# Patient Record
Sex: Male | Born: 1955 | Race: White | Hispanic: No | Marital: Married | Smoking: Never smoker
Health system: Southern US, Academic
[De-identification: ages and names within clinical notes are randomized; demographics above are authoritative.]

## PROBLEM LIST (undated history)

## (undated) DIAGNOSIS — E7800 Pure hypercholesterolemia, unspecified: Secondary | ICD-10-CM

## (undated) DIAGNOSIS — I10 Essential (primary) hypertension: Secondary | ICD-10-CM

## (undated) DIAGNOSIS — K219 Gastro-esophageal reflux disease without esophagitis: Secondary | ICD-10-CM

## (undated) DIAGNOSIS — G4730 Sleep apnea, unspecified: Secondary | ICD-10-CM

## (undated) HISTORY — PX: HX BACK SURGERY: SHX140

## (undated) HISTORY — PX: HX GALL BLADDER SURGERY/CHOLE: SHX55

## (undated) HISTORY — PX: HX KNEE ARTHROSCOPY: SHX127

---

## 2014-07-22 ENCOUNTER — Emergency Department (HOSPITAL_COMMUNITY): Payer: Self-pay | Admitting: Emergency Medicine

## 2017-12-11 NOTE — Progress Notes (Signed)
 Steven Juarez returns following an uncomplicated laparoscopic cholecystectomy on Nov 14, 2017 for biliary dyskinesia with hyperdynamic gallbladder emptying.  He reports no difficulty with GI function, with resolution of his bloating and only a couple of emeses.  Overall, he is quite pleased with the results, able to eat a salad now with dressing without difficulty.      On exam, all of the port sites are healing nicely without any sign of infection, although there is still a bit of residual redness around the umbilicus from a presumed infection with about 4 more days left on his antibiotics.    He is released back to the care of his PCP, with any further followup here on a prn basis.  He should refrain from strenuous activity or lifting in excess of 25 lbs. for another 2 weeks.

## 2020-07-10 LAB — COLOGUARD® COLON CANCER SCREEN: COLOGUARD RESULT: NEGATIVE

## 2021-07-03 IMAGING — MR MRI CERVICAL SPINE WITHOUT CONTRAST
4 of 5 series · 25 of 48 positions shown · IV contrast (gadolinium)
Comparison: None available.

﻿EXAM:  [id number]   MRI CERVICAL SPINE WITHOUT CONTRAST
INDICATION: Chronic neck pain with bilateral upper extremity radiculopathy.
TECHNIQUE: Multiplanar multisequential MRI of the cervical spine was performed without gadolinium contrast.

[Series 5: T2 · sagittal · 3.0mm · 0.78mm/px · 7 of 11 slices shown (1 of 2)]
[im 1/11]
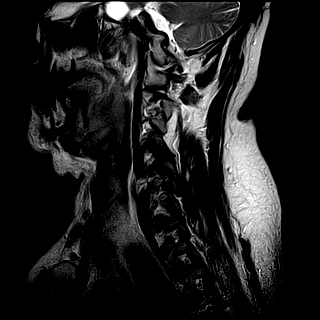
[im 2/11]
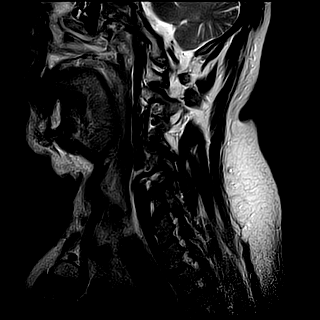
[im 4/11]
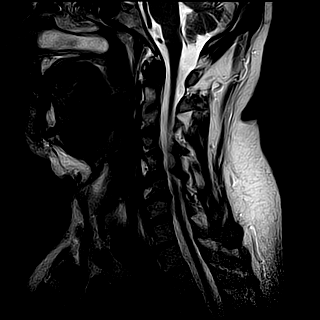
[im 6/11]
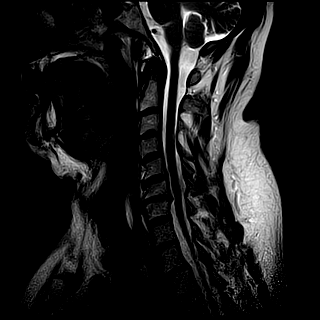
[im 7/11]
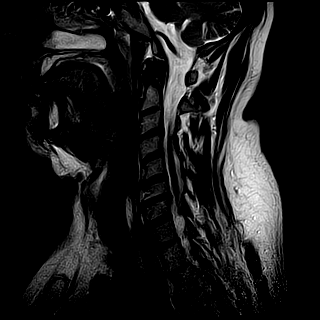
[im 9/11]
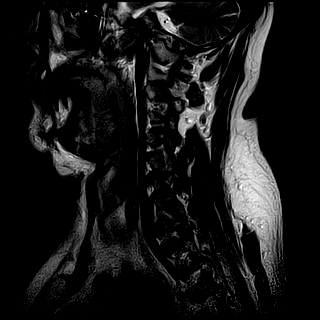
[im 11/11]
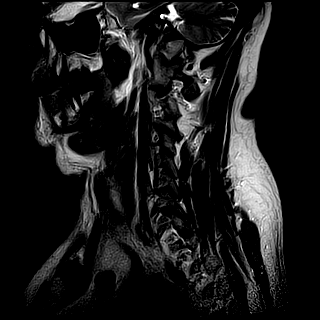

[Series 6: T1 · sagittal · 3.0mm · 0.49mm/px · 5 of 11 slices shown]
[im 1/11]
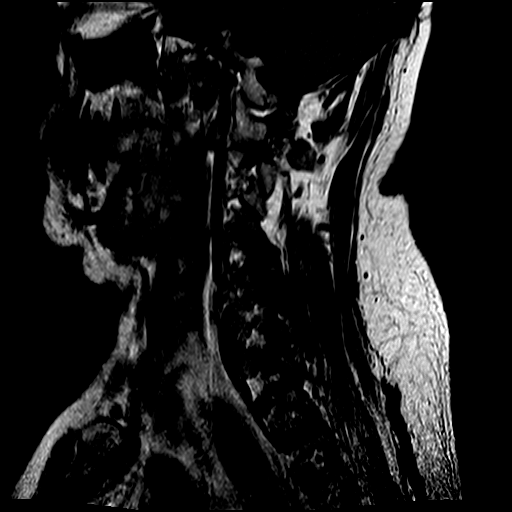
[im 2/11]
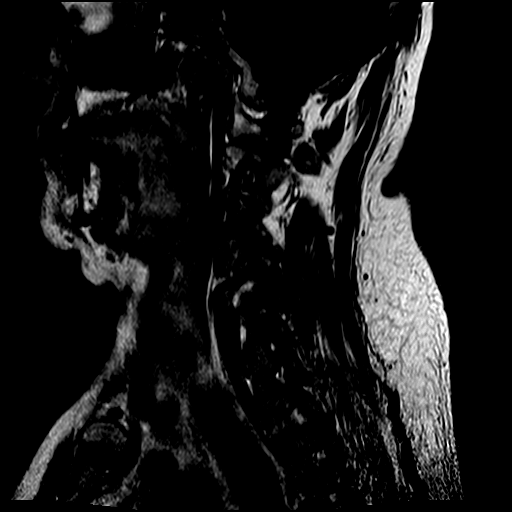
[im 4/11]
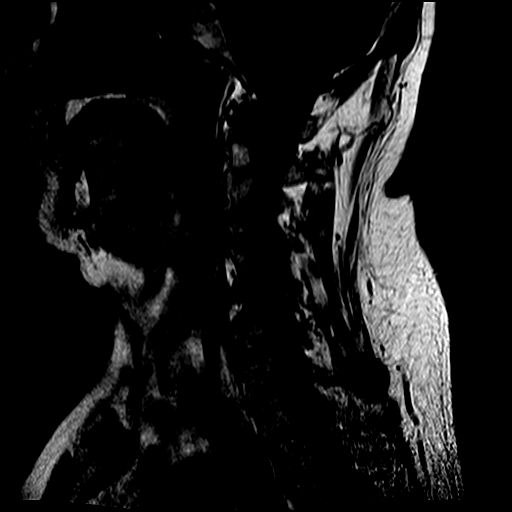
[im 6/11]
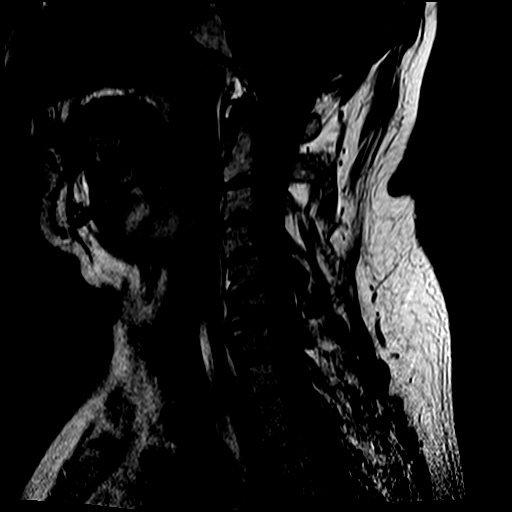
[im 9/11]
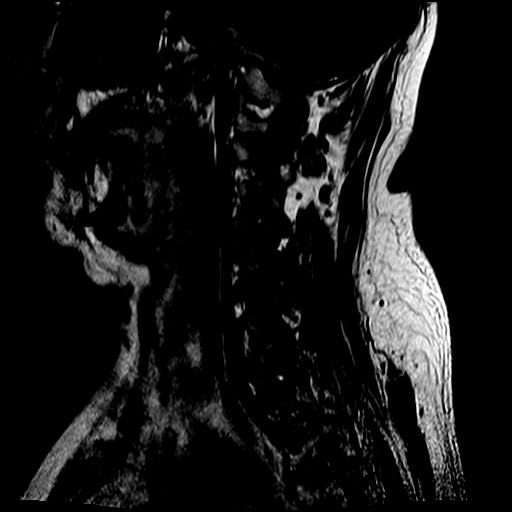

[Series 7: STIR · sagittal · 3.0mm · 0.49mm/px · 3 of 11 slices shown]
[im 2/11]
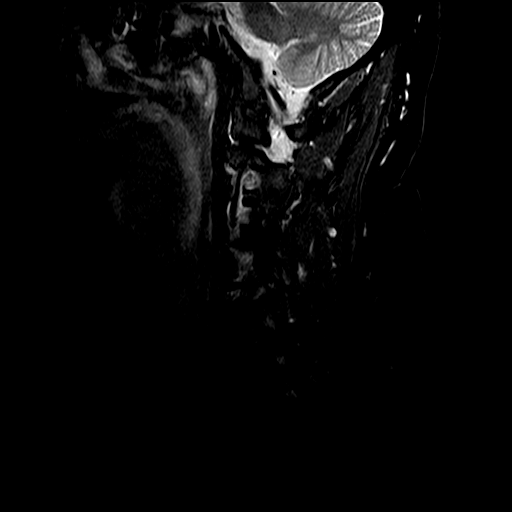
[im 6/11]
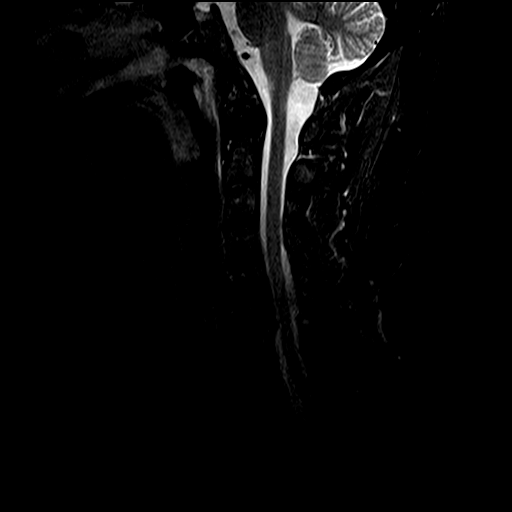
[im 9/11]
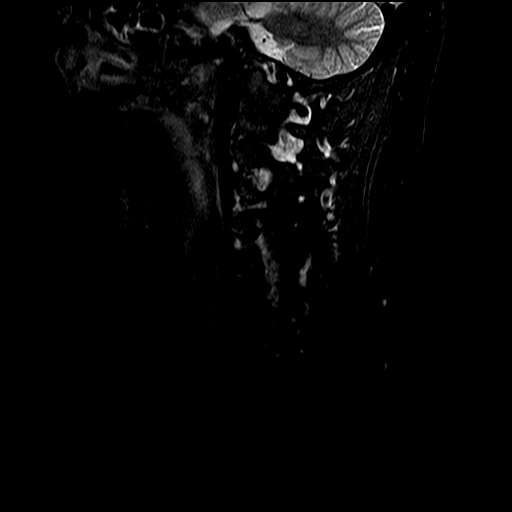

[Series 9: T2 · axial · 3.5mm · 0.40mm/px · z∈[-89,-6]mm · 10 of 26 slices shown (2 of 2)]
[im 2/26]
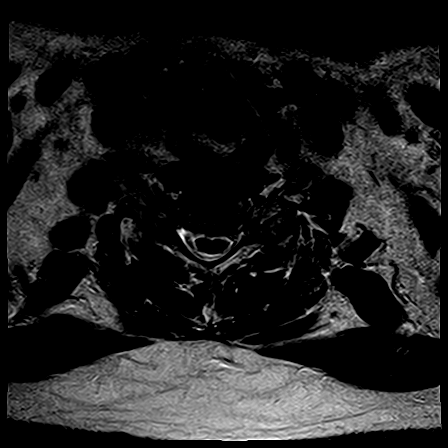
[im 4/26]
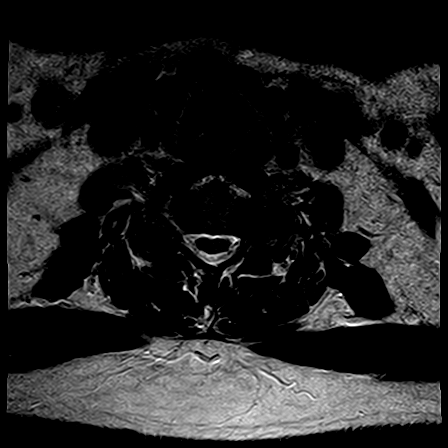
[im 6/26]
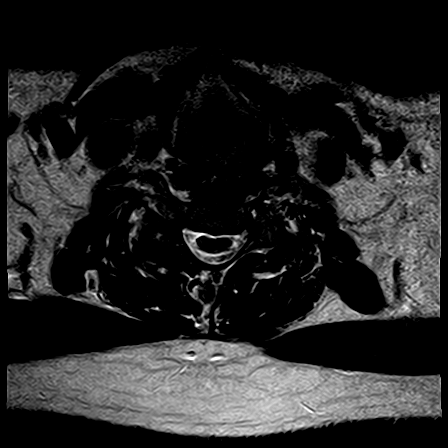
[im 9/26]
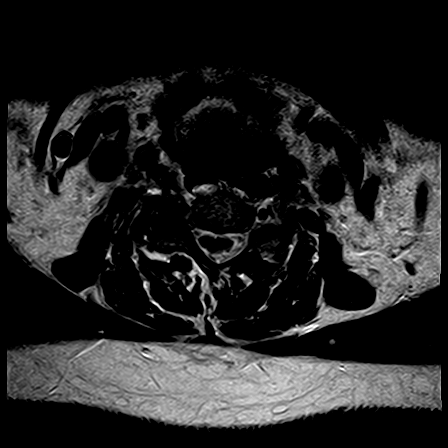
[im 12/26]
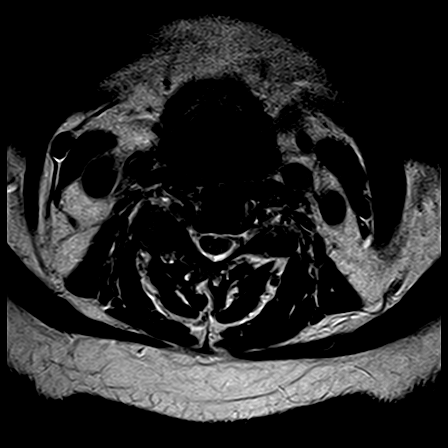
[im 14/26]
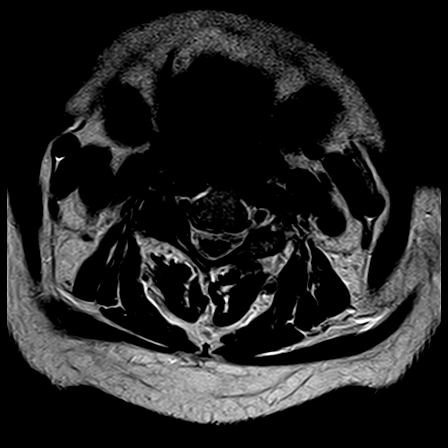
[im 16/26]
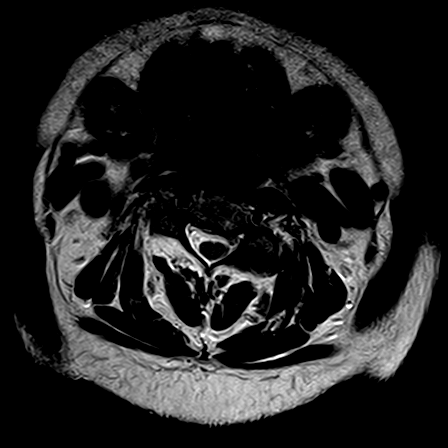
[im 19/26]
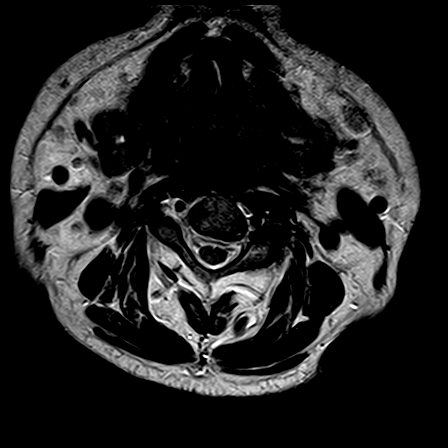
[im 22/26]
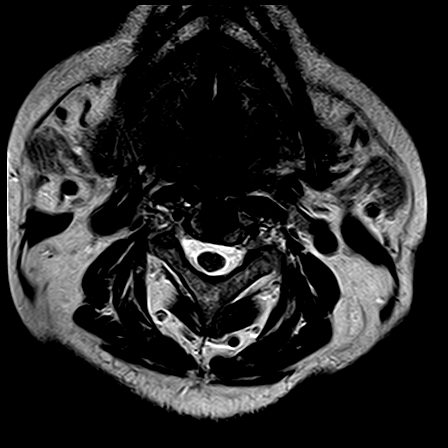
[im 26/26]
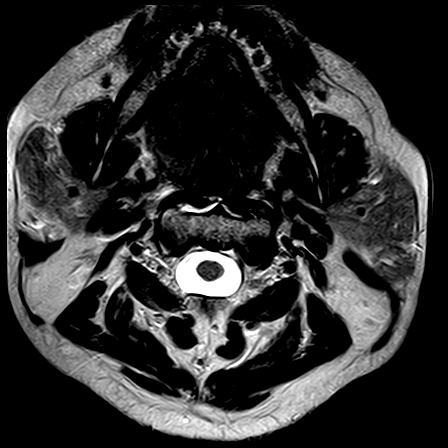

[25 of 48 positions shown; findings below may reference images not displayed]

FINDINGS: Bone marrow signal intensity is normal. There is no acute fracture or subluxation. Visualized spinal cord is normal in signal intensity without evidence of compression at any level.

C2-3 level is unremarkable.

At C3-4 level, there is severe left neural foraminal stenosis from facet and uncovertebral joint hypertrophy.

At C4-5 level, there is minimal anterolisthesis of C4 on C5 vertebral body. There is severe right and mild left neural foraminal stenosis from facet and uncovertebral joint hypertrophy.

At C5-6 level, there is minimal anterolisthesis of C5 on C6 vertebral body. There is a minimal bulging annulus, minimally effacing the ventral CSF. There is moderate to severe left and mild right neural foraminal stenosis from facet and uncovertebral joint hypertrophy.

At C6-7 level, there is a small broad-based central disc osteophyte complex with near complete effacement of the ventral CSF. There is severe left and mild right neural foraminal stenosis from facet and uncovertebral joint hypertrophy.

At C7-T1 level, there is a small broad-based central disc bulge, mildly effacing the ventral thecal sac. There is no significant neural foraminal stenosis.

Paraspinal soft tissues are unremarkable.
IMPRESSION: 1. Minimal anterolisthesis of C4 on C5 and C5 on C6 vertebral body. 

2. Near complete effacement of the ventral CSF at C6-7 level from a small central disc osteophyte complex. 

3. Multilevel neural foraminal stenosis as detailed above.

## 2021-07-03 IMAGING — MR MRI THORACIC SPINE WITHOUT CONTRAST
4 of 5 series · 26 of 48 positions shown · IV contrast (gadolinium)
Comparison: None available.

﻿EXAM:  [id number]   MRI THORACIC SPINE WITHOUT CONTRAST
INDICATION: Chronic mid back pain.
TECHNIQUE: Multiplanar multisequential MRI of the thoracic spine was performed without gadolinium contrast.

[Series 5: T2 · sagittal · 4.0mm · 0.78mm/px · 8 of 13 slices shown (1 of 2)]
[im 1/13]
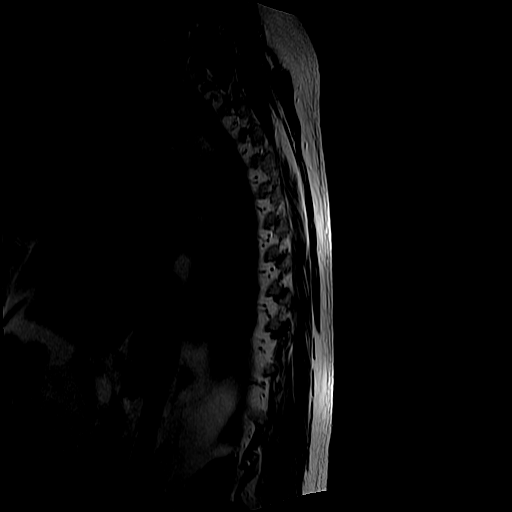
[im 2/13]
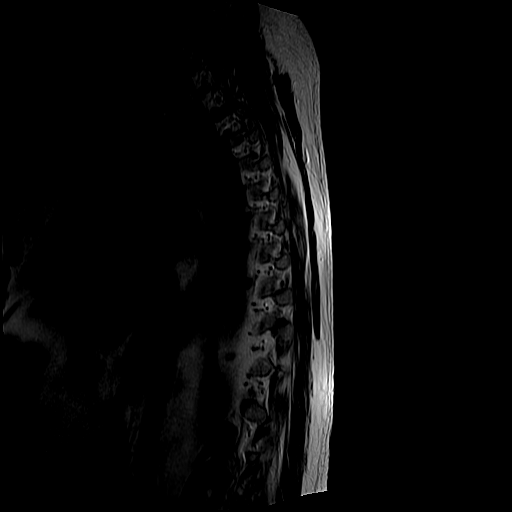
[im 5/13]
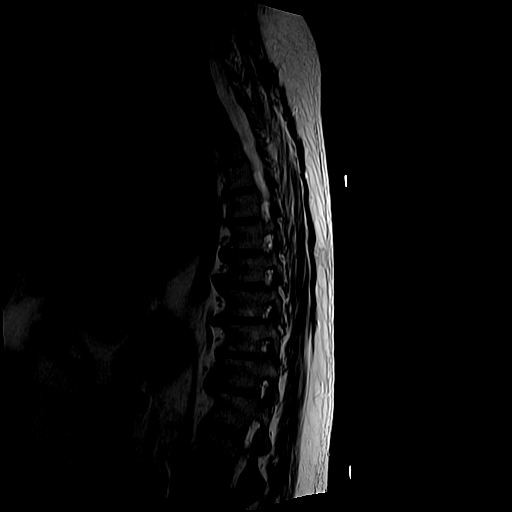
[im 6/13]
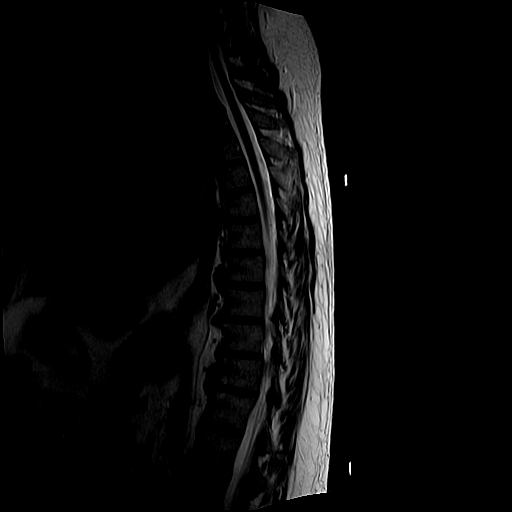
[im 7/13]
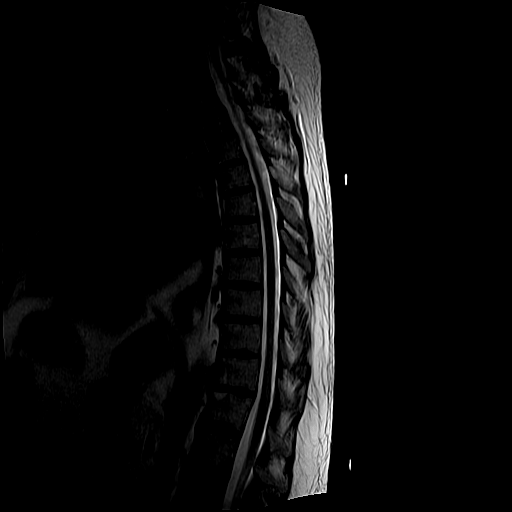
[im 9/13]
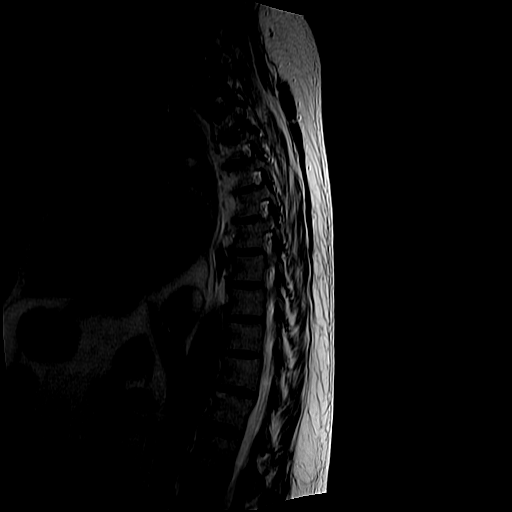
[im 11/13]
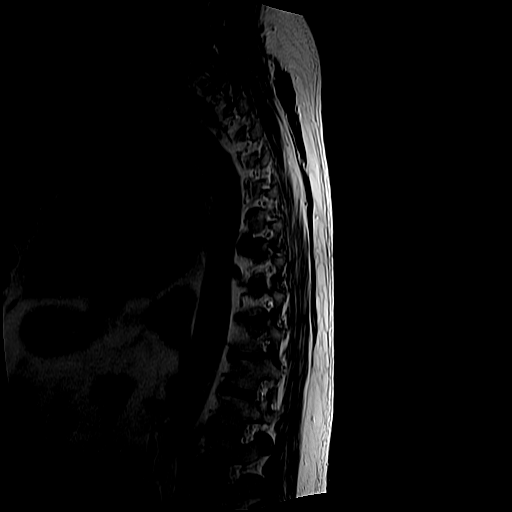
[im 13/13]
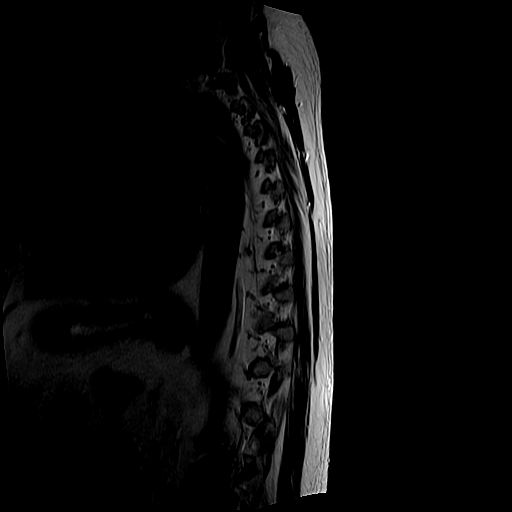

[Series 6: T1 · sagittal · 4.0mm · 0.78mm/px · 6 of 13 slices shown (1 of 2)]
[im 1/13]
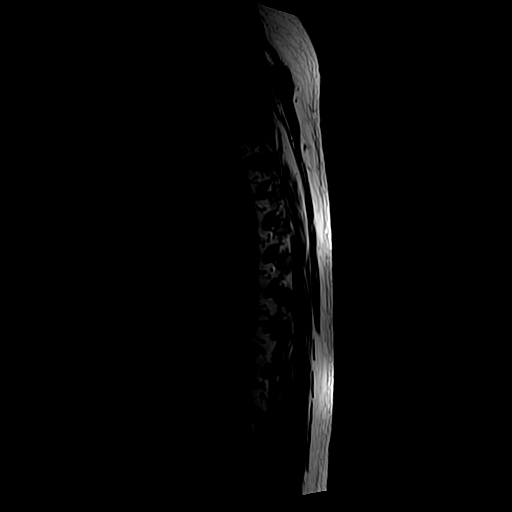
[im 2/13]
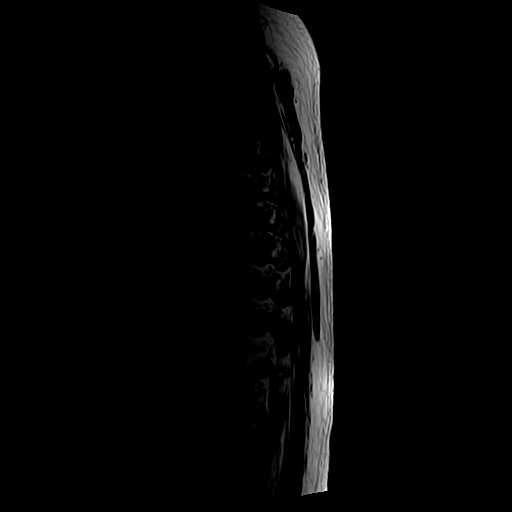
[im 5/13]
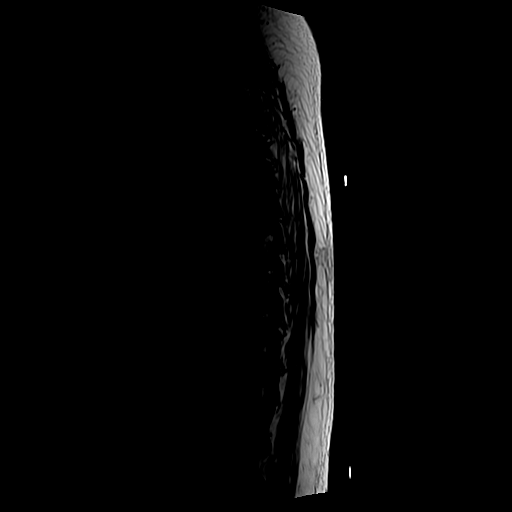
[im 6/13]
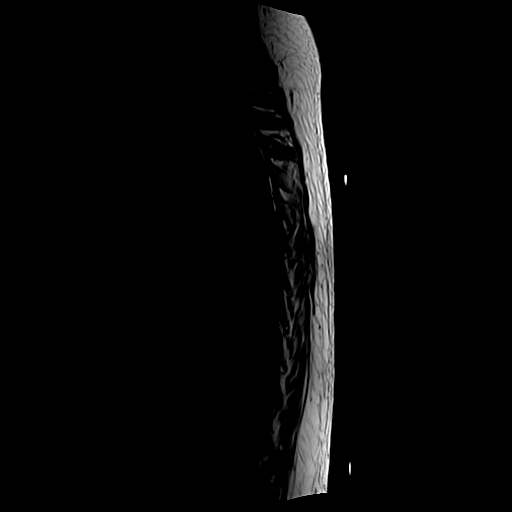
[im 7/13]
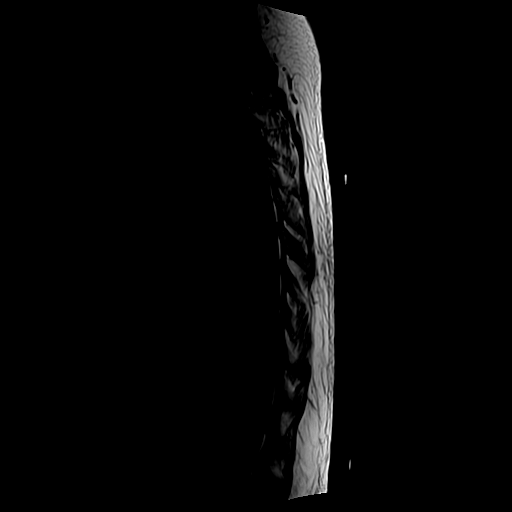
[im 11/13]
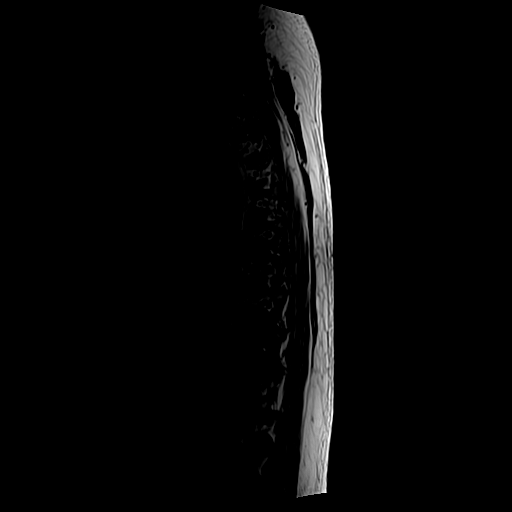

[Series 8: T2 · axial · 4.0mm · 0.62mm/px · z∈[-296,-80]mm · 9 of 12 slices shown (2 of 2)]
[im 1/12]
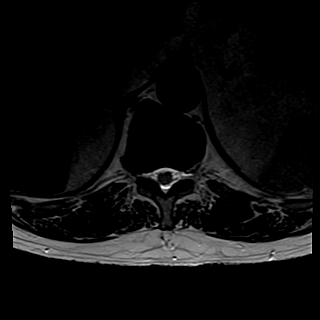
[im 2/12]
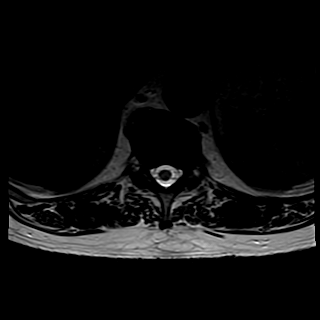
[im 3/12]
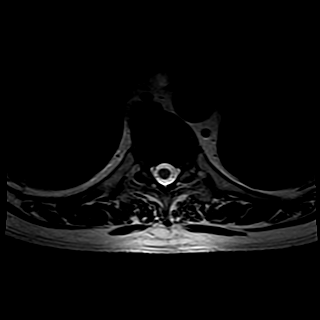
[im 5/12]
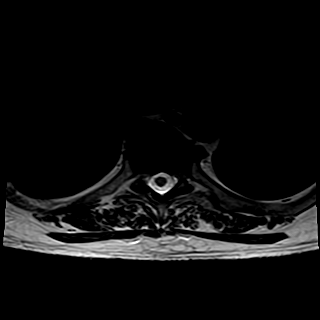
[im 6/12]
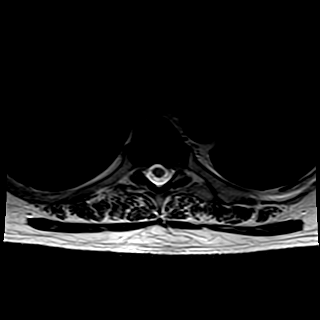
[im 7/12]
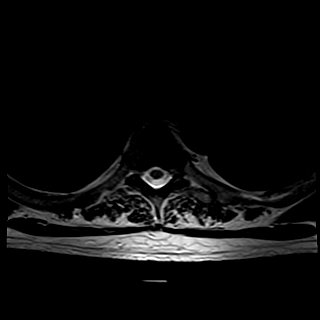
[im 9/12]
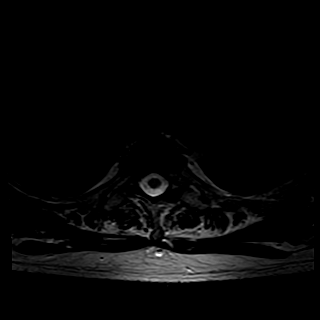
[im 10/12]
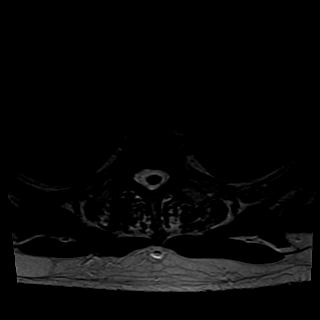
[im 12/12]
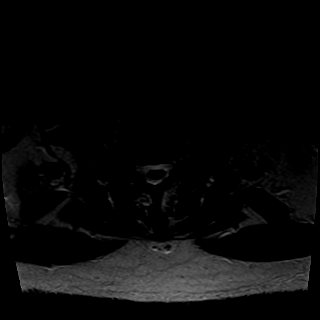

[Series 9: T1 · axial · 4.0mm · 0.62mm/px · z∈[-270,-117]mm · 3 of 12 slices shown (2 of 2)]
[im 2/12]
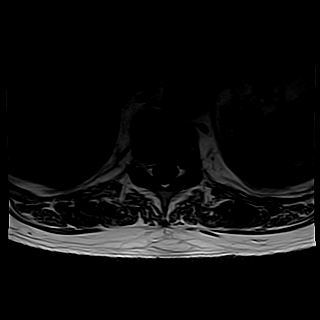
[im 6/12]
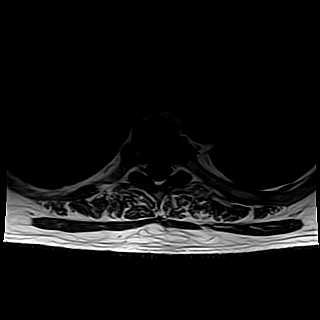
[im 10/12]
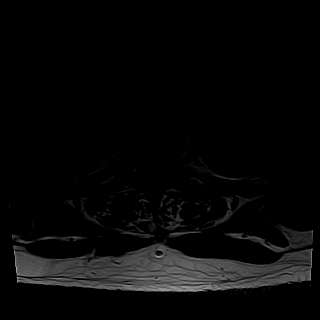

[26 of 48 positions shown; findings below may reference images not displayed]

FINDINGS: Vertebral bodies are normal in height, alignment and signal intensity. There is no acute fracture or subluxation. Visualized spinal cord is normal in signal intensity without evidence of compression at any level.

There is no significant disc herniation, spinal canal or neural foraminal stenosis at any level. Paraspinal soft tissues are also unremarkable. There is no pleural effusion.
IMPRESSION: Unremarkable exam.

## 2021-12-24 IMAGING — US CAROTID US W/DOPPLER
1 series · 14 of 24 positions shown · non-contrast
Comparison: None available.

﻿EXAM:  CAROTID US W/DOPPLER
INDICATION: Carotid stenosis.

[Series 1: carotid us w/doppler · 14 of 68 slices shown]
[im 1/68]
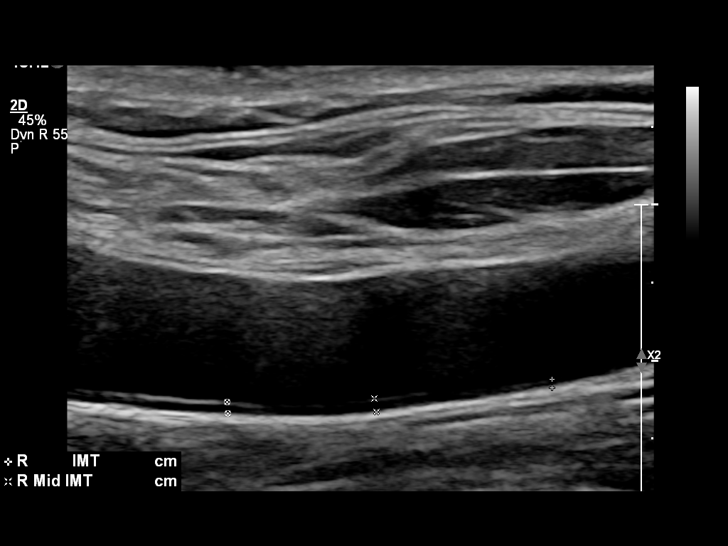
[im 6/68]
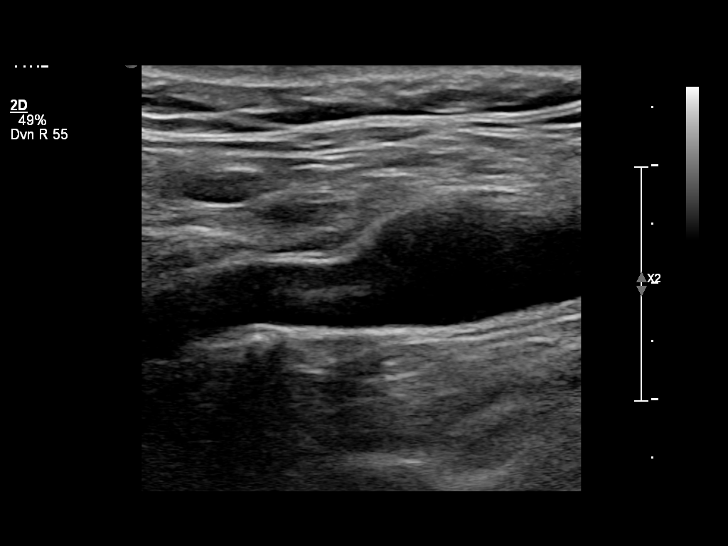
[im 12/68]
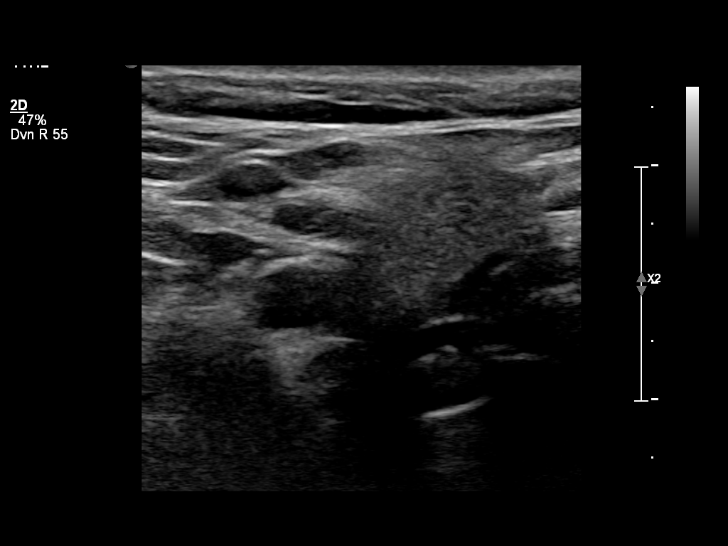
[im 18/68]
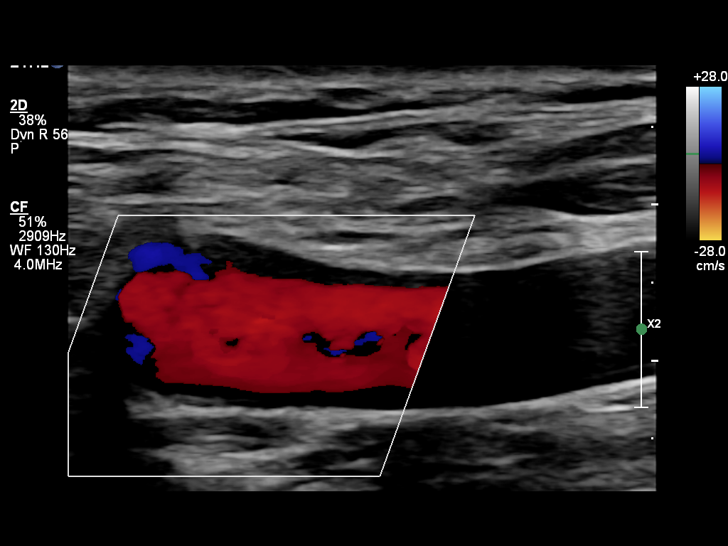
[im 21/68]
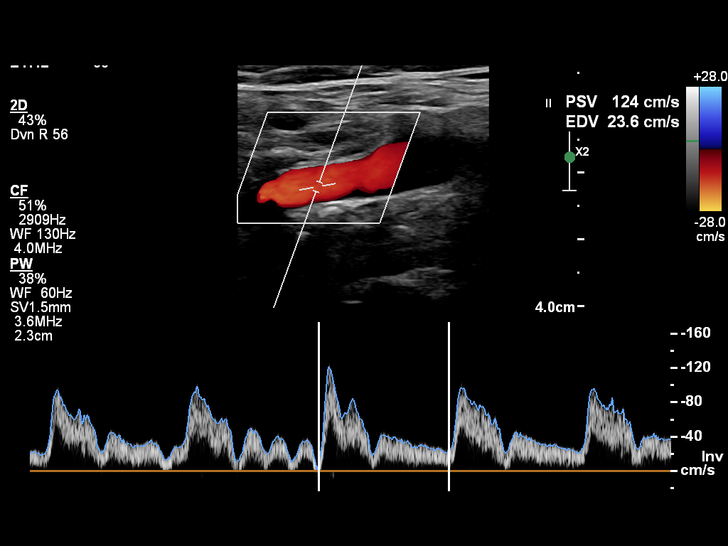
[im 27/68]
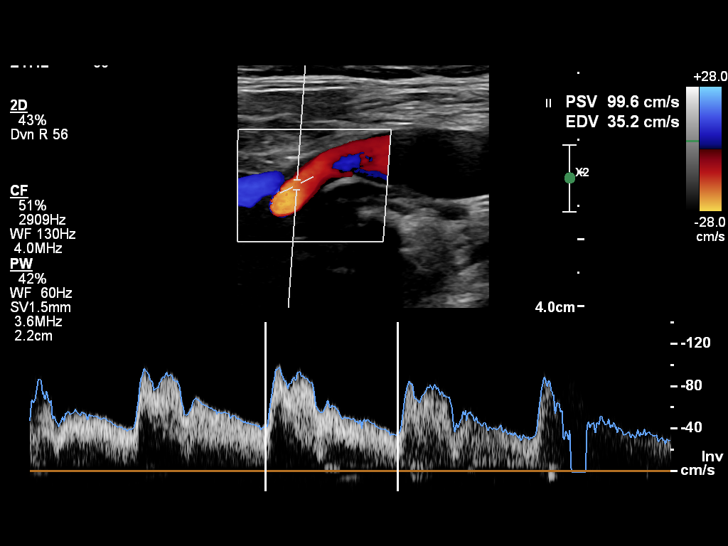
[im 33/68]
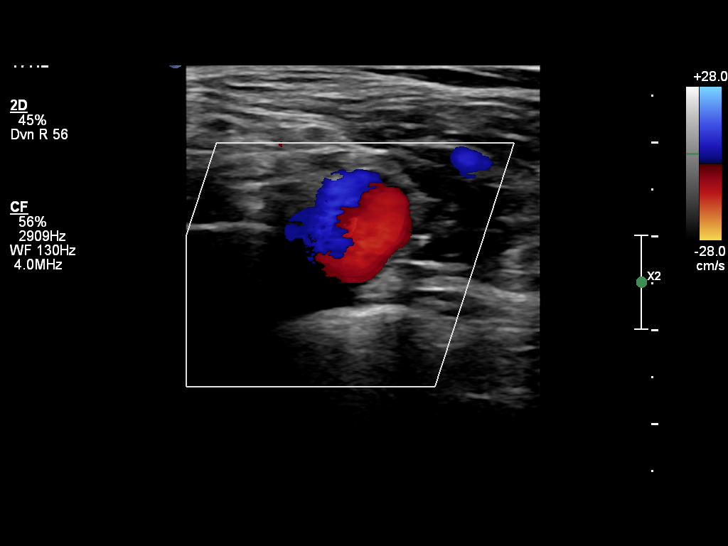
[im 35/68]
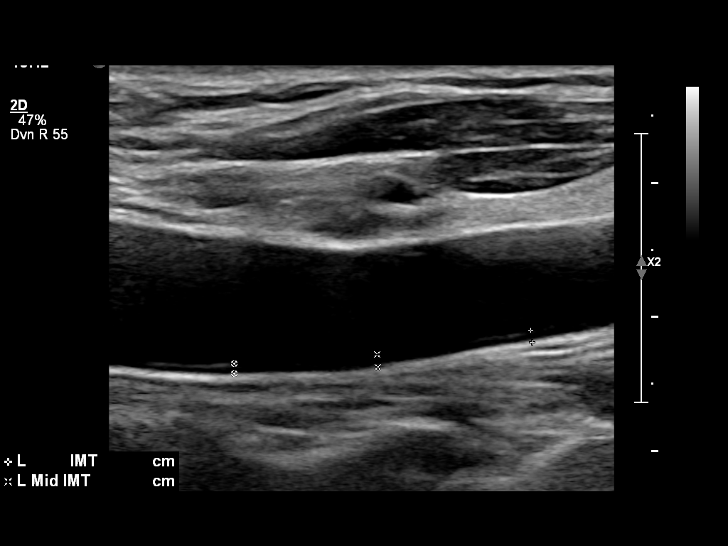
[im 41/68]
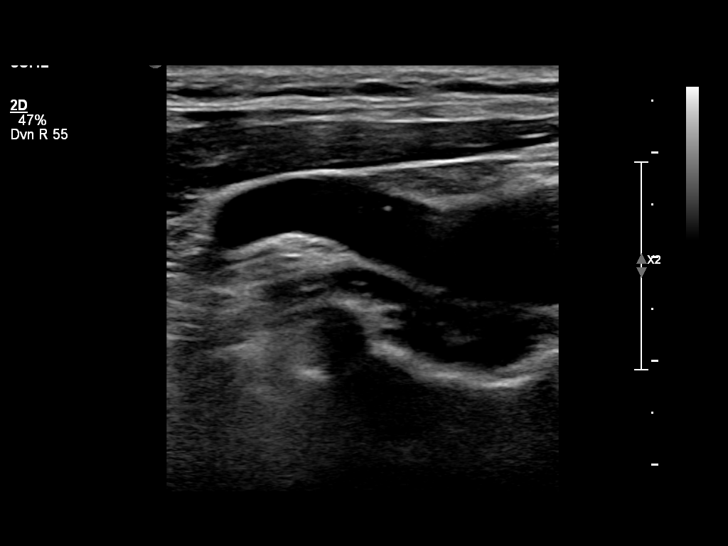
[im 47/68]
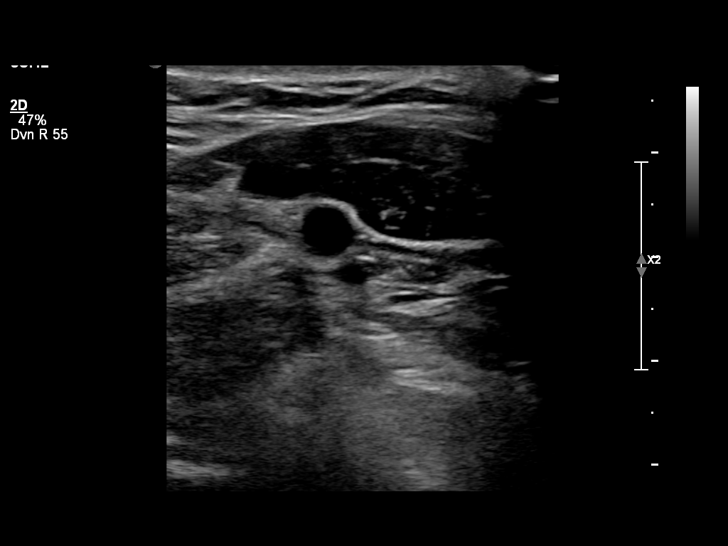
[im 53/68]
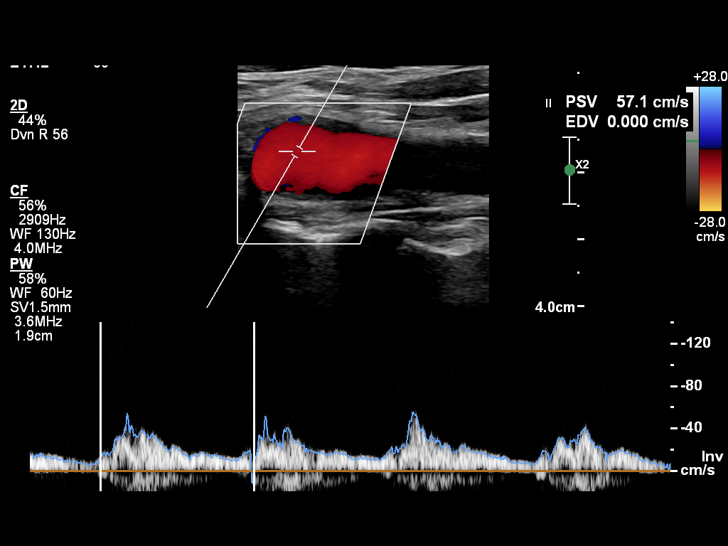
[im 56/68]
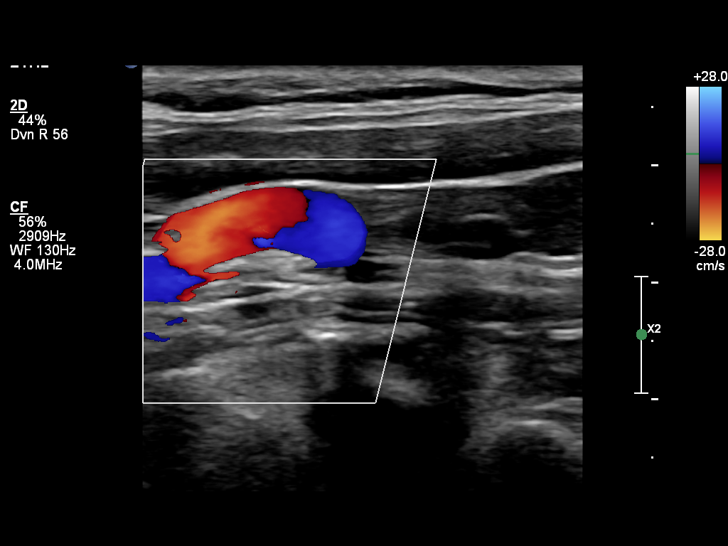
[im 62/68]
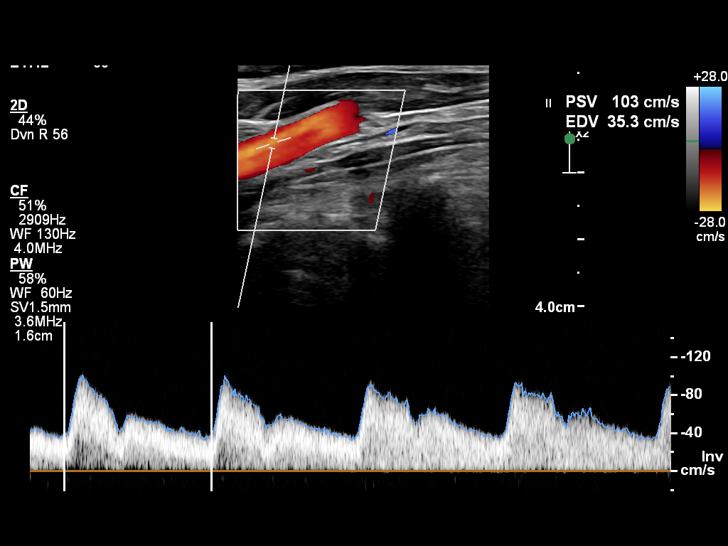
[im 68/68]
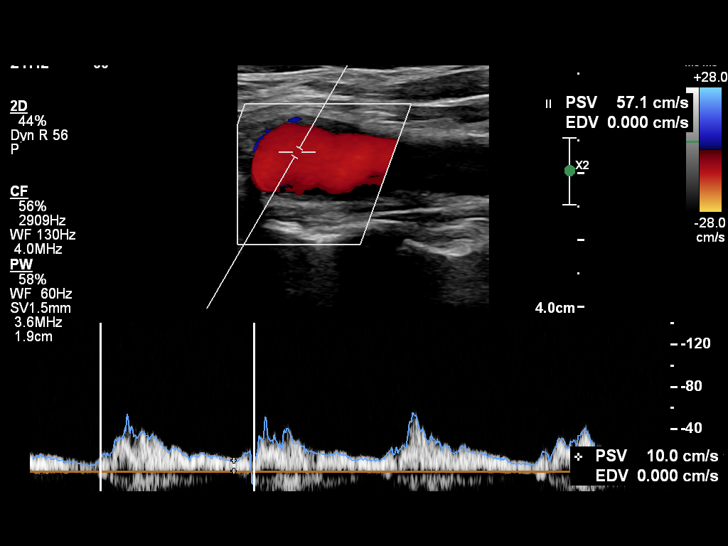

[14 of 24 positions shown; findings below may reference images not displayed]

FINDINGS: Right CCA

102

Left CCA

227

PSV

IC/CC

EDV

Right Internal

103

1.01

35

Left Internal

117

0.52

34

Right Vertebral

Antegrade

Left Vertebral

Antegrade

There is no significant atherosclerotic plaque within the carotid bulbs and internal carotid arteries. No hemodynamically significant stenosis or abnormal elevation of velocity is seen at any level. Both vertebral arteries are patent with appropriate antegrade direction of flow.
IMPRESSION: No hemodynamically significant stenosis within the internal carotid arteries as per NASCET criteria.

## 2022-02-25 IMAGING — MR MRI KNEE LT W/O CONTRAST
4 of 5 series · 25 of 40 positions shown · IV contrast (gadolinium)
Comparison: Outside radiograph of the left knee dated 02/05/2022.  Previous MRI of left knee dated 04/06/2019.

﻿EXAM:  [id number]   MRI KNEE LT W/O CONTRAST
INDICATION: 66-year-old with chronic left knee pain.  No history of previous knee surgery.
TECHNIQUE: Multiplanar, multisequential MRI of the left knee was performed without gadolinium contrast.

[Series 5: PD fat-sat · axial · left · 4.0mm · 0.53mm/px · z∈[-68,+62]mm · 8 of 30 slices shown (1 of 3)]
[im 1/30]
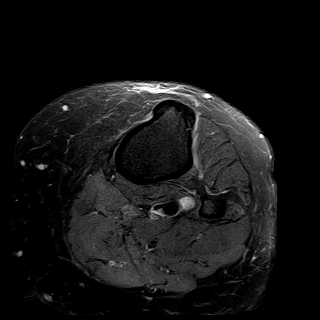
[im 5/30]
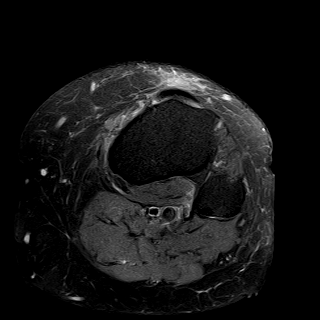
[im 9/30]
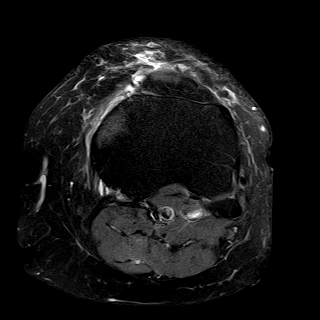
[im 13/30]
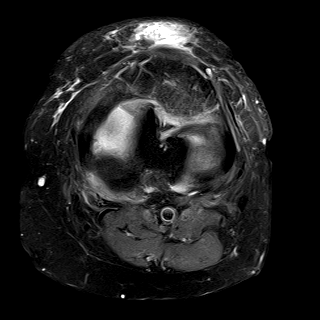
[im 17/30]
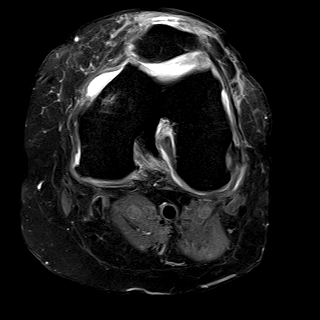
[im 21/30]
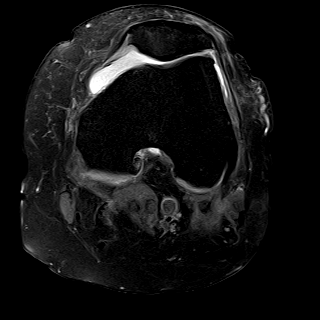
[im 25/30]
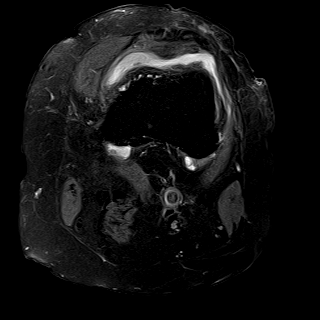
[im 30/30]
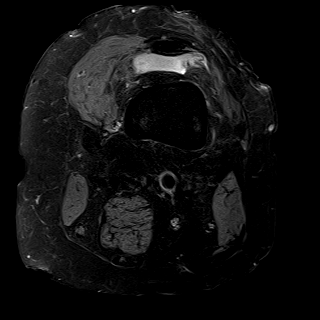

[Series 6: PD fat-sat · sagittal · left · 3.0mm · 0.31mm/px · 8 of 30 slices shown (2 of 3)]
[im 1/30]
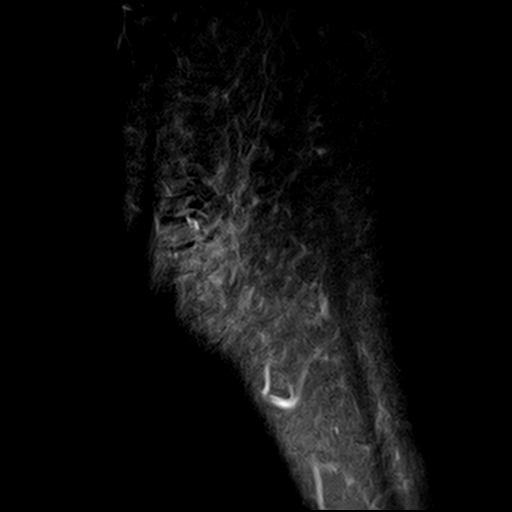
[im 5/30]
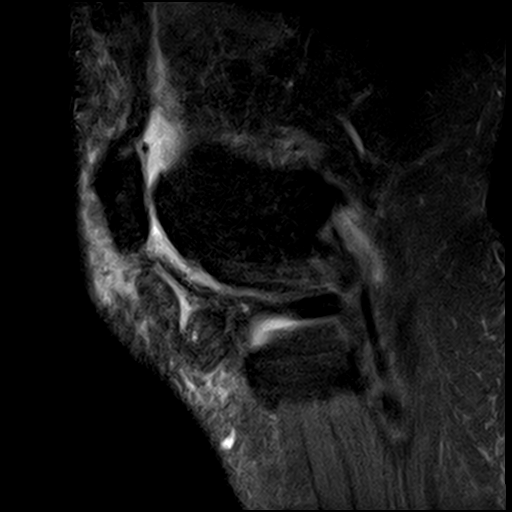
[im 9/30]
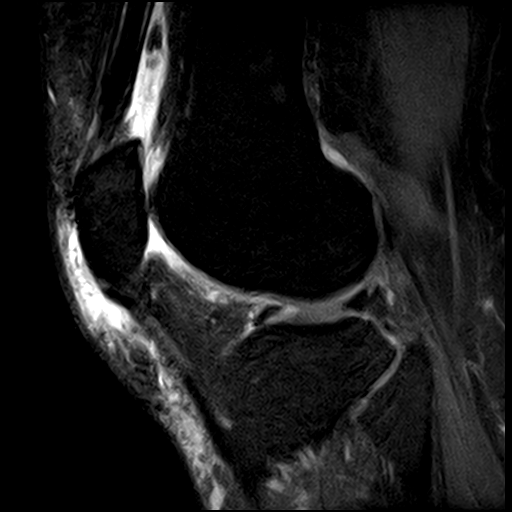
[im 13/30]
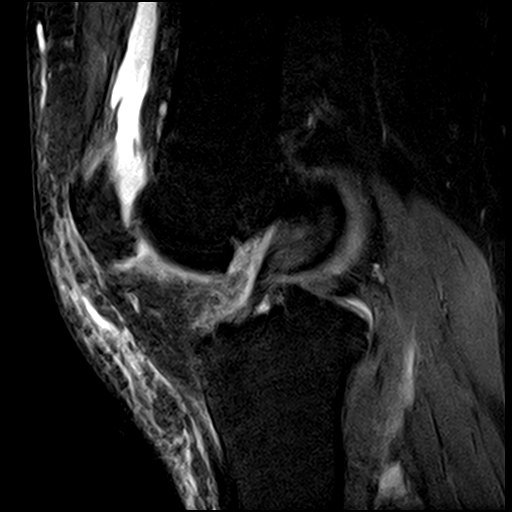
[im 17/30]
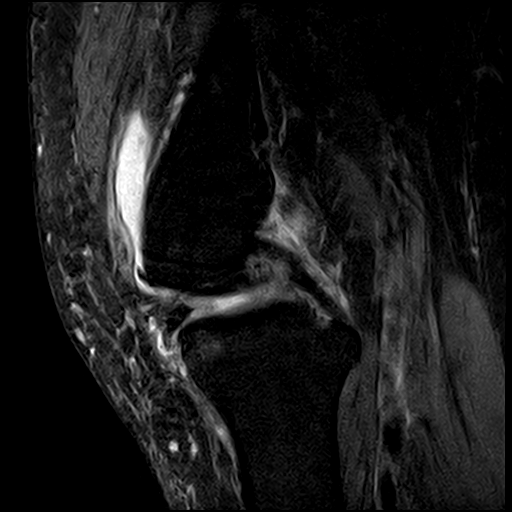
[im 21/30]
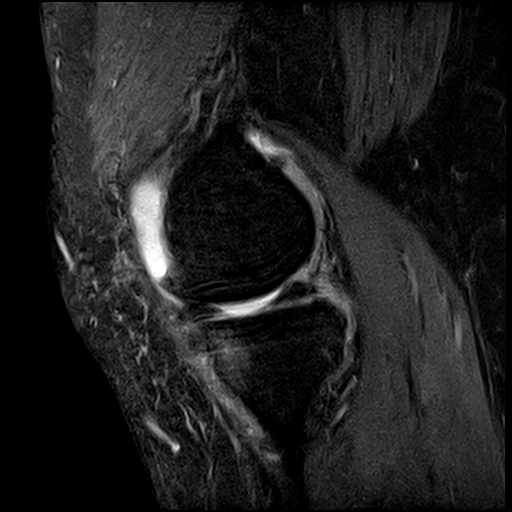
[im 25/30]
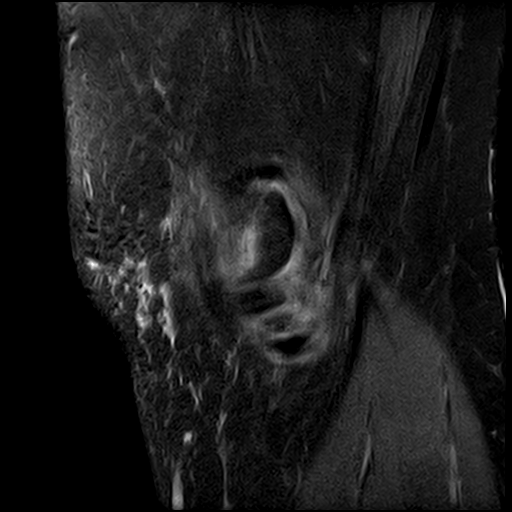
[im 30/30]
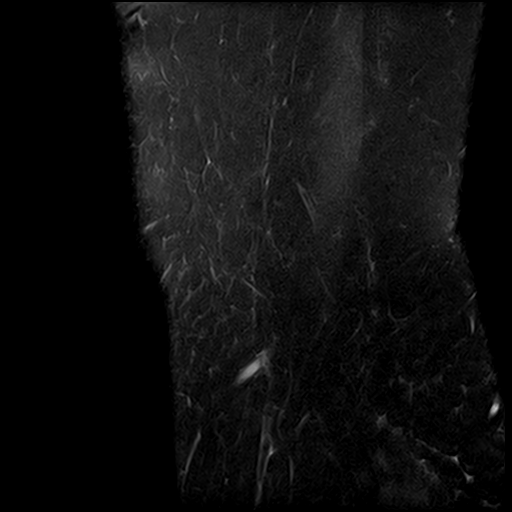

[Series 7: T1 · sagittal · left · 3.0mm · 0.31mm/px · 3 of 30 slices shown]
[im 5/30]
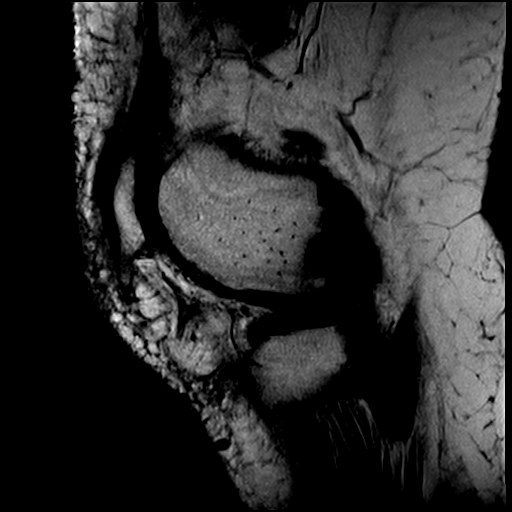
[im 17/30]
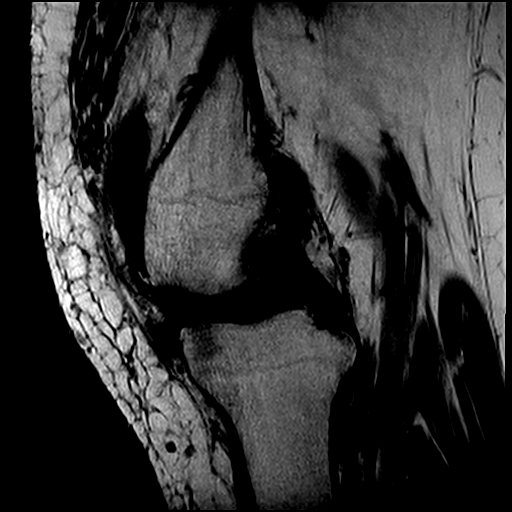
[im 25/30]
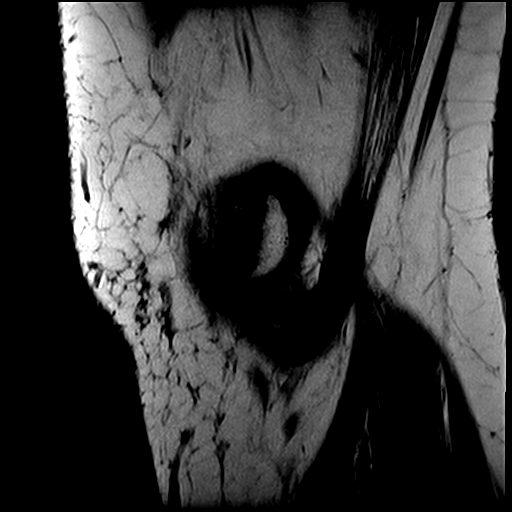

[Series 9: PD fat-sat · coronal · left · 3.2mm · 0.33mm/px · 6 of 30 slices shown (3 of 3)]
[im 1/30]
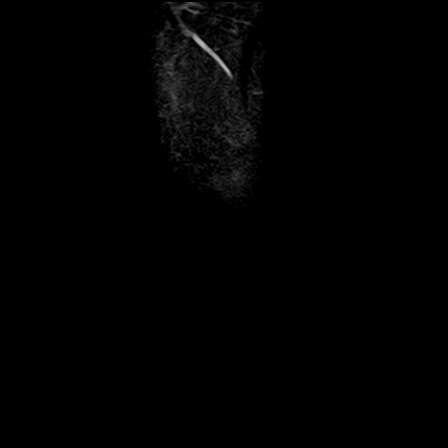
[im 5/30]
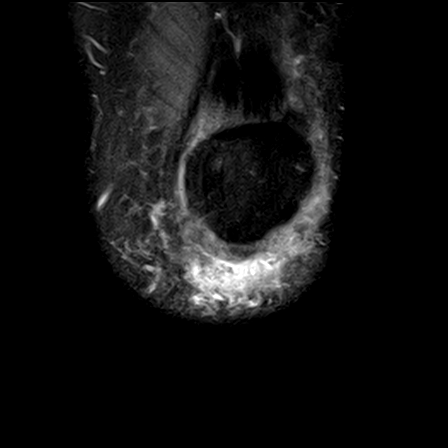
[im 9/30]
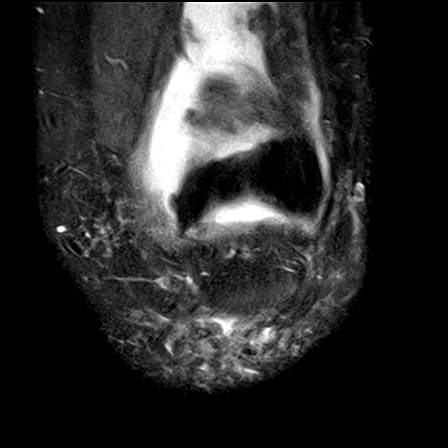
[im 13/30]
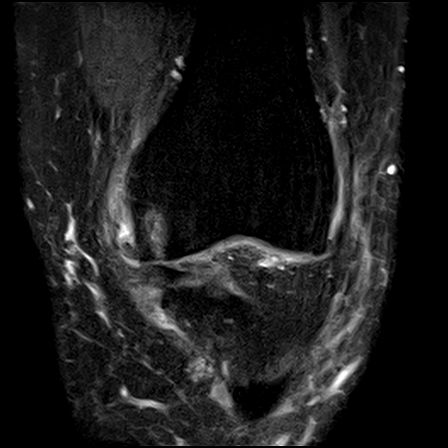
[im 17/30]
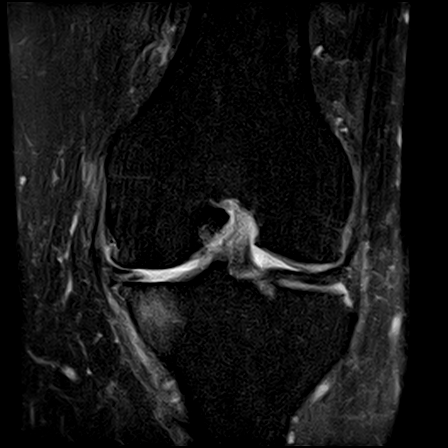
[im 25/30]
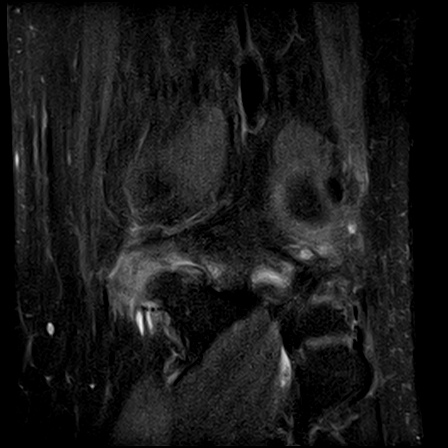

[25 of 40 positions shown; findings below may reference images not displayed]

FINDINGS: No acute bony lesions are seen at the left knee.

Mild degenerative changes of lateral articular cartilage and lateral meniscus.  Anterior and posterior cruciate ligaments are intact. 

Advanced, grade 4 degenerative changes of medial articular cartilage are noted with subchondral bone marrow edema of the medial tibial plateau.  Severe deformity of the mid and posterior horn of the medial meniscus is noted with chronic degenerative changes and chronic complex tear.  Significant medial extrusion of mid medial meniscus.

Collateral ligaments are intact. Quadriceps tendon and patellar tendon are intact.  Grade 3 degenerative changes of patellofemoral articular cartilage over the lateral facet.  Large effusion is noted in the knee joint.  No evidence of osteochondral loose bodies is seen.

Infrapatellar, prepatellar bursitis of moderate degree.
IMPRESSION: 1. Advanced, grade 4 degenerative changes of medial articular cartilage are noted with subchondral bone marrow edema of the medial tibial plateau. 

2. Severe deformity of the mid and posterior horn of the medial meniscus is noted with chronic degenerative changes and chronic complex tear.  Significant medial extrusion of mid medial meniscus.

3. Prepatellar, infrapatellar bursitis. Effusion in the knee joint.

4. Overall findings are essentially unchanged from previous study of 04/06/2019 except for some deterioration of the degenerative changes of the medial compartment and chronic complex tear of the medial meniscus.

## 2022-05-03 ENCOUNTER — Other Ambulatory Visit: Payer: Self-pay

## 2022-05-03 ENCOUNTER — Inpatient Hospital Stay (HOSPITAL_BASED_OUTPATIENT_CLINIC_OR_DEPARTMENT_OTHER)
Admission: RE | Admit: 2022-05-03 | Discharge: 2022-05-03 | Disposition: A | Discharge status: Home / Self Care | Payer: 59 | Source: Ambulatory Visit

## 2022-05-03 ENCOUNTER — Other Ambulatory Visit (HOSPITAL_COMMUNITY): Payer: Self-pay | Admitting: Orthopaedic Surgery

## 2022-05-03 ENCOUNTER — Other Ambulatory Visit: Payer: 59 | Attending: Orthopaedic Surgery

## 2022-05-03 DIAGNOSIS — M1712 Unilateral primary osteoarthritis, left knee: Secondary | ICD-10-CM | POA: Insufficient documentation

## 2022-05-03 DIAGNOSIS — Z01818 Encounter for other preprocedural examination: Secondary | ICD-10-CM

## 2022-05-03 DIAGNOSIS — Z01810 Encounter for preprocedural cardiovascular examination: Secondary | ICD-10-CM

## 2022-05-03 LAB — CBC WITH DIFF
BASOPHIL #: 0.1 10*3/uL (ref 0.00–0.10)
BASOPHIL %: 1 % (ref 0–1)
EOSINOPHIL #: 0.4 10*3/uL (ref 0.00–0.50)
EOSINOPHIL %: 6 %
HCT: 41.8 % (ref 36.7–47.1)
HGB: 14.2 g/dL (ref 12.5–16.3)
LYMPHOCYTE #: 1.4 10*3/uL (ref 1.00–3.00)
LYMPHOCYTE %: 26 % (ref 16–44)
MCH: 30.3 pg (ref 23.8–33.4)
MCHC: 34 g/dL (ref 32.5–36.3)
MCV: 89.1 fL (ref 73.0–96.2)
MONOCYTE #: 0.5 10*3/uL (ref 0.30–1.00)
MONOCYTE %: 10 % (ref 5–13)
MPV: 8.3 fL (ref 7.4–11.4)
NEUTROPHIL #: 3.1 10*3/uL (ref 1.85–7.80)
NEUTROPHIL %: 57 % (ref 43–77)
PLATELETS: 211 10*3/uL (ref 140–440)
RBC: 4.69 10*6/uL (ref 4.06–5.63)
RDW: 14.3 % (ref 12.1–16.2)
WBC: 5.5 10*3/uL (ref 3.6–10.2)

## 2022-05-03 LAB — URINALYSIS, MACROSCOPIC
BILIRUBIN: NEGATIVE mg/dL
BLOOD: NEGATIVE mg/dL
GLUCOSE: NEGATIVE mg/dL
KETONES: NEGATIVE mg/dL
LEUKOCYTES: NEGATIVE WBCs/uL
NITRITE: NEGATIVE
PH: 7.5 (ref 5.0–9.0)
PROTEIN: NEGATIVE mg/dL
SPECIFIC GRAVITY: 1.007 (ref 1.002–1.030)
UROBILINOGEN: NORMAL mg/dL

## 2022-05-03 LAB — BASIC METABOLIC PANEL
ANION GAP: 5 mmol/L (ref 4–13)
BUN/CREA RATIO: 17 (ref 6–22)
BUN: 19 mg/dL (ref 7–25)
CALCIUM: 9.5 mg/dL (ref 8.6–10.3)
CHLORIDE: 104 mmol/L (ref 98–107)
CO2 TOTAL: 30 mmol/L (ref 21–31)
CREATININE: 1.14 mg/dL (ref 0.60–1.30)
ESTIMATED GFR: 72 mL/min/{1.73_m2} (ref >59)
GLUCOSE: 112 mg/dL — ABNORMAL HIGH (ref 74–109)
OSMOLALITY, CALCULATED: 281 mOsm/kg (ref 270–290)
POTASSIUM: 4.2 mmol/L (ref 3.5–5.1)
SODIUM: 139 mmol/L (ref 136–145)

## 2022-05-03 LAB — URINALYSIS, MICROSCOPIC
RBCS: 1 /hpf (ref <4)
WBCS: <1 /hpf (ref <6)

## 2022-05-03 LAB — ECG 12 LEAD
Atrial Rate: 75 {beats}/min
Calculated P Axis: 64 degrees
Calculated R Axis: 116 degrees
Calculated T Axis: 20 degrees
PR Interval: 196 ms
QRS Duration: 146 ms
QT Interval: 390 ms
QTC Calculation: 435 ms
Ventricular rate: 75 {beats}/min

## 2022-05-03 LAB — HGA1C (HEMOGLOBIN A1C WITH EST AVG GLUCOSE): HEMOGLOBIN A1C: 6.3 % — ABNORMAL HIGH (ref 4.0–6.0)

## 2022-05-21 DIAGNOSIS — E039 Hypothyroidism, unspecified: Secondary | ICD-10-CM

## 2022-05-21 DIAGNOSIS — G4733 Obstructive sleep apnea (adult) (pediatric): Secondary | ICD-10-CM

## 2022-05-21 DIAGNOSIS — E782 Mixed hyperlipidemia: Secondary | ICD-10-CM

## 2022-05-21 DIAGNOSIS — I10 Essential (primary) hypertension: Secondary | ICD-10-CM

## 2022-05-21 DIAGNOSIS — Z96652 Presence of left artificial knee joint: Principal | ICD-10-CM

## 2022-05-21 NOTE — H&P (Signed)
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 States pain and giving away medially/anteroir actvity related with walking, initating gait, standing from a sitting position, and nighttime pain. Denies injury or parestheisa. Conservative care includes rest, ice, heat, NSAIDS, brace, series of injections, and PT with little to no releif in symtpoms. States interring with his daily activity.     PAST MEDICAL HISTORY  Z68.34: Body mass index (BMI) 34.0-34.9, adult Denies history of cancer Cardiac: Hypertension and Hyperlipidemia Hypothryoid Musculoskeletal: Denies OA, gout and other common musculoskeletal problems. Rheumatologic: Denies N/A     PAST SURGICAL HISTORY  Date Side/ Site Procedure Details Doctor Facility Complications 12/20/20 Right Lower extremity-Knee 03/31/18 Cholecystectomy none 03/31/89 back surgery none Knee arthroscopy Partial MM; Debridement Lindwood Qua MD, Deloris Ping [facility]     ALLERGIES  Name Reactions Notes iodine unknown     MEDICATIONS  Name Maxzide-25mg  37.5-25 mg 1 oral qd ------Diuretic - Potassium Sparing-Thiazide & Related Combinations ------INTERACTIONS: Drug-drug interaction to Lotrel; Zoloft 50 mg 1 oral qd ------Antidepressant - Selective Serotonin Reuptake Inhibitors (SSRIs) Protonix 40 mg 1 oral qd ------Gastric Acid Secretion Reducing Agents - Proton Pump Inhibitors (PPIs) Crestor 10 mg 1 oral qd ------Antihyperlipidemic - HMG CoA Reductase Inhibitors (statins) Synthroid 150 mcg 1 oral qd ------Thyroid Hormones - Synthetic T4 (Thyroxine) Lotrel 20-5 mg 1 oral qd Strength QTY Sig  Date 2020-11-09  2020-11-09  2020-11-09  2020-11-09  2020-11-09  2020-11-09     Family and Social History  Patient is Married. 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 No brothers; 2 sisters.; Diabetes in mother, sister and Grandmother. Hypertension in Grandfather and Grandmother Cholesterol Grandmother N/A, Grandfather, No known family history of MI, Stroke, Bleeding, Blood clots, Neurologic conditions     REVIEW OF SYSTEMS  General: No history of change in weight, fever, chills, change in energy, fatigue or changes in sleep habits. Opthalmic: Negative for change in vision / eye problems. ENT: Denies congestion, sinus, throat and hearing issues. Respiratory: Denies SOB, cough and other respiratory symptoms. Cardiac: Denies chest pain, CHF and other cardiac symptoms. GI: No changes in bowel habits, abdominal pain or other GI complaint. Male GU: Denies significant male genitourinary symptoms. Musculoskeletal: Denies significant pain, joint or muscular symptoms. Neurological: Denies cephalgia, seizures, weakness, numbness and other neurologic symptoms. Endocrine: Denies polyuria, polydypsia and other endocrine related complaints. Behavioral: Denies significant mood and psychological symptoms. Hemeatologic: Denies anemia, bleeding / clotting problems and other blood related complaints. Stop Bang Interpretation: : High risk of OSA due to Raw score 2 or greater plus one or more of facto rs including male gender, BMI >35 or Neck size: Score: 2 Hg A1C rec based on age, BMI RCRI, no risk factors, risk of cardiac death, nonfatal MI and cardiac arrest, 0.4%. Risk of MI, Pulmonary edema, ventricular 2  fib, cardiac arrest and complete heart block 0.5% POUR / IPSS score 1. Score classified as mild level of symptoms, with no or mild increase in risk of urinary retention. DVT Prophylaxis : Based on current recommendation and patient specific risk factors, DVT prophlyaxis reccommended is Ecotrin 81 BID X 4 wk ASA II : Mild systemic disease Charlson score: 2 resulting in estimated in hospital mortality rate of 6.8%  EXAM  BMI 34.2; Temp: ; HR ; BP  /; H: 6' 2.5"; Wt 270lb pO2 / RA. Body weight targets: Ideal body wt below197.38 BMI 30 236.85. Reviewed BMI and local resources for weight management.  left knee: Varus alignment, moderate flexion contracture, patellofemoral crepitation noted, ligam ent exam is normal, tender over the medial joint line, No tenderness lateral joint line, Homan sign is negative - no calf pain on palpation, PP palapable, varicose veins noted, PVD with mild swelling noted on lower extremity, neurovacular exam unremarkable, and hip exam is normal. General exam   BMI   Cardiac, respiratory and mental status NORMAL     X-RAYS  Date Name Review 02/25/2022 L Knee MR Medial meniscus: complex tear. PrimaryArthritis:KelligrenLarsenGrade4-end stage/Varus;involvingmedial and patellofemoral compartments. 1. Advanced, grade 4 degenerative changes of medial articular cartilage are noted with subchondral bone marrow edema of the medial tibial plateau. 2. Severe deformity of the mid and posterior horn of the medial meniscus is noted with chronic degenerative changes and chronic complex tear. Significant medial extrusion of mid medial meniscus. 3. Prepatellar, infrapatellar bursitis. Effusion in the knee joint. 4. Overall findings are essentially unchanged from previous study of 04/06/2019 except for some deterioration of the degenerative changes of the medial compartment and chronic complex tear of the medial meniscus. Review of outside study & report 02/05/2022 Bil KN AP L Lat Stand 2VL Right knee arthritis: Primary;KLstage2- mild. Compartments: medial. <50% medial joint space narrowing. Left Knee Primary arthritis, KL stage 4- end stage. Compartments: medial. Complete loss of medial joint space, bone on bone changes. 09/20/202 0 L lower extremity US  NIVL / Duplex Review of outside report: N/A No evidence of deep venous thrombotic disease. 09/20/202 0 L Knee XR Left Knee. Arthritis: Ardeen Jourdain Grade 3- moderate/ Varus;involvingmedial and  patellofemoral compartment. Patellofemoral: arthritis.     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 Patient states due to failed conservative care and severity and length of symptoms and declining quality of life he wishes to proceed with surgical interventions. Discussed the risks, benefits, complications, and expected outcomes all in detail including the risk of infection and neurovascular compromise. Discussed post-op plan of care. Patients family will be assisting at home with care, order given for assisted devices, will be doing outpt PT, and discussed DVT prophylaxis. Stop Bang-hx of Sleep Apna , Cardiac 0, Urinary 1, ASA 2, and [place] 2. Pre-risk stratifies to ASA. Encouraged patient to call office if any problems or questions arise preoperatively or postoperatively. The discussed continued until the patient understood and had no further questions. Discussed Designer, jewellery and Shared decision-making materials given and reviewed in detail. Pt. voiced understanding.  surgical informed consent obtained. Functional assessment KOOS completed.  Orders:   Date Name Review 03/06/2022 L KN CT [place] N/A 02/25/2022 L Knee MR Medial meniscus: complex tear. PrimaryArthritis:KelligrenLarsenGrade4-end stage/Varus;involvingmedial and patellofemoral compartments. 1. Advanced, grade 4 degenerative changes of medial articular cartilage are noted with subchondral bone marrow edema of the medial tibial plateau. 2. Severe deformity of the mid and posterior horn of the medial meniscus is noted  with  chronic degenerative changes and chronic complex tear. Significant medial extrusion of mid medial meniscus. 3. Prepatellar, infrapatellar bursitis. Effusion in the knee joint. 4. Overall findings are essentially unchanged from previous study of 04/06/2019 except for some deterioration of the degenerative changes of the medial compartment and chronic complex tear of the medial meniscus. Review of outside study &report 02/05/2022 Bil KN AP L Lat Stand 2VL Right knee arthritis: Primary;KLstage2- mild. Compartments: medial. <50% medial joint space narrowing. Left Knee Primary arthritis, KL stage 4- end stage. Compartments: medial. Complete loss of medial joint space, bone on bone changes. 10/30/2020 R Knee MR Medial meniscus: complex tear. with extrusion. Chondromalacia (Mod [place]) Patella: Gr 2 Fissures. 1. Complex tear involving the body of the medial meniscus. 2. Grade 3 chondromalacia patella. 3. Small suprapatellar effusion. Review of outside study & report 09/06/2020 Bil KN XR AP LAT Stand 2VL 2VR Right knee arthritis: Primary;KLstage2- mild. Compartments: medial. <50% medial joint space narrowing. Left Knee Primary arthritis, KL stage 4- end stage. Compartments: medial. Complete loss of medial joint space, bone on bone changes. 04/06/2019 L Knee MR Medial meniscus tear. Arthritis: Ardeen Jourdain Grade 4-end stage/ Varus;involvingmedial and patellofemoral compartments. 04/01/2019 Bil KN XR AP STAND Left knee shows changes of arthritis KL grade 3-moderateinVarusalignmentinvolvingmedial and patellofemoral compartment. Patellofemoral: arthritis. Arthritis: Ardeen Jourdain Grade 3-moderate/Varus;involvingmedial and patellofemoral compartment. 09/20/202 0 L lower extremity US  NIVL / Duplex Review of outside report: N/A No evidence of deep venous thrombotic disease.  09/20/202 0 L Knee XR Left Knee. Arthritis: Ardeen Jourdain Grade 3- moderate/ Varus;involvingmedial and patellofemoral compartment.  Patellofemoral: arthritis.  Date Name Review 03/06/2022 Wheeled walker N/A 03/06/2022 Bedside commode N/A 04/14/2019 L KN PT Eval TX N/A     RETURN / FOLLOW-UP  Left TKA scheduled    Optimization:  Pt on CPAP for OSA, medical problem stable.    Attestation:  Patient has been examined before anesthesia, and there are no changes in medical status or indications for surgery.       Waldron Session, MD / Trinda Pascal, FNP-C

## 2022-05-22 ENCOUNTER — Inpatient Hospital Stay (HOSPITAL_COMMUNITY): Payer: 59 | Admitting: Anesthesiology

## 2022-05-22 ENCOUNTER — Encounter (HOSPITAL_COMMUNITY)
Admission: RE | Disposition: A | Discharge status: Home / Self Care | Payer: Self-pay | Source: Ambulatory Visit | Attending: Orthopaedic Surgery

## 2022-05-22 ENCOUNTER — Observation Stay (HOSPITAL_COMMUNITY): Payer: 59 | Admitting: Orthopaedic Surgery

## 2022-05-22 ENCOUNTER — Other Ambulatory Visit: Payer: Self-pay

## 2022-05-22 ENCOUNTER — Encounter (HOSPITAL_COMMUNITY): Payer: Self-pay | Admitting: Orthopaedic Surgery

## 2022-05-22 ENCOUNTER — Observation Stay
Admission: RE | Admit: 2022-05-22 | Discharge: 2022-05-23 | Disposition: A | Discharge status: Home / Self Care | Payer: 59 | Source: Ambulatory Visit | Attending: Orthopaedic Surgery | Admitting: Orthopaedic Surgery

## 2022-05-22 DIAGNOSIS — Z96659 Presence of unspecified artificial knee joint: Secondary | ICD-10-CM | POA: Diagnosis present

## 2022-05-22 DIAGNOSIS — E782 Mixed hyperlipidemia: Secondary | ICD-10-CM

## 2022-05-22 DIAGNOSIS — M1712 Unilateral primary osteoarthritis, left knee: Secondary | ICD-10-CM | POA: Insufficient documentation

## 2022-05-22 DIAGNOSIS — M21162 Varus deformity, not elsewhere classified, left knee: Principal | ICD-10-CM | POA: Insufficient documentation

## 2022-05-22 DIAGNOSIS — Z96652 Presence of left artificial knee joint: Principal | ICD-10-CM

## 2022-05-22 DIAGNOSIS — I10 Essential (primary) hypertension: Secondary | ICD-10-CM

## 2022-05-22 DIAGNOSIS — E039 Hypothyroidism, unspecified: Secondary | ICD-10-CM

## 2022-05-22 DIAGNOSIS — G4733 Obstructive sleep apnea (adult) (pediatric): Secondary | ICD-10-CM

## 2022-05-22 HISTORY — DX: Essential (primary) hypertension: I10

## 2022-05-22 HISTORY — DX: Pure hypercholesterolemia, unspecified: E78.00

## 2022-05-22 HISTORY — DX: Gastro-esophageal reflux disease without esophagitis: K21.9

## 2022-05-22 HISTORY — DX: Sleep apnea, unspecified: G47.30

## 2022-05-22 SURGERY — ARTHOPLASTY KNEE TOTAL MAKO ROBOTIC ASSISTED
Anesthesia: Spinal | Site: Knee | Laterality: Left | Wound class: Clean Wound: Uninfected operative wounds in which no inflammation occurred

## 2022-05-22 MED ORDER — DOCUSATE SODIUM 100 MG CAPSULE
100.0000 mg | ORAL_CAPSULE | Freq: Two times a day (BID) | ORAL | Status: DC
Start: 2022-05-22 — End: 2022-05-23
  Administered 2022-05-22 – 2022-05-23 (×2): 100 mg via ORAL
  Filled 2022-05-22 (×2): qty 1

## 2022-05-22 MED ORDER — ETHYL ALCOHOL 62 % (NOZIN NASAL SANITIZER) NASAL SOLUTION - BULK BOTTLE
1.0000 | Freq: Two times a day (BID) | NASAL | Status: DC
Start: 2022-05-22 — End: 2022-05-23
  Administered 2022-05-22 – 2022-05-23 (×2): 1 via NASAL

## 2022-05-22 MED ORDER — TRANEXAMIC ACID 1,000 MG/10 ML (100 MG/ML) INTRAVENOUS SOLUTION
INTRAVENOUS | Status: AC
Start: 2022-05-22 — End: 2022-05-22
  Filled 2022-05-22: qty 10

## 2022-05-22 MED ORDER — CEFAZOLIN 1 GRAM SOLUTION FOR INJECTION
2.0000 g | Freq: Once | INTRAMUSCULAR | Status: DC
Start: 2022-05-22 — End: 2022-05-22

## 2022-05-22 MED ORDER — SODIUM CHLORIDE 0.9 % INTRAVENOUS PIGGYBACK
1000.0000 mg | INJECTION | Freq: Once | INTRAVENOUS | Status: AC
Start: 2022-05-22 — End: 2022-05-22
  Administered 2022-05-22: 1000 mg via INTRAVENOUS

## 2022-05-22 MED ORDER — ONDANSETRON HCL (PF) 4 MG/2 ML INJECTION SOLUTION
Freq: Once | INTRAMUSCULAR | Status: DC | PRN
Start: 2022-05-22 — End: 2022-05-22
  Administered 2022-05-22: 4 mg via INTRAVENOUS

## 2022-05-22 MED ORDER — ROPIVACAINE (PF) 2 MG/ML (0.2 %) INJECTION SOLUTION
INTRAMUSCULAR | Status: AC
Start: 2022-05-22 — End: 2022-05-22
  Filled 2022-05-22: qty 30

## 2022-05-22 MED ORDER — PROPOFOL 10 MG/ML IV BOLUS
INJECTION | Freq: Once | INTRAVENOUS | Status: DC | PRN
Start: 2022-05-22 — End: 2022-05-22
  Administered 2022-05-22: 200 mg via INTRAVENOUS
  Administered 2022-05-22: 50 mg via INTRAVENOUS

## 2022-05-22 MED ORDER — DEXAMETHASONE SODIUM PHOSPHATE 4 MG/ML INJECTION SOLUTION
Freq: Once | INTRAMUSCULAR | Status: DC | PRN
Start: 2022-05-22 — End: 2022-05-22
  Administered 2022-05-22: 4 mg via INTRAVENOUS

## 2022-05-22 MED ORDER — ETHYL ALCOHOL 62 % (NOZIN NASAL SANITIZER) NASAL SOLUTION - BULK BOTTLE
1.0000 | Freq: Once | NASAL | Status: DC
Start: 2022-05-22 — End: 2022-05-22
  Administered 2022-05-22: 1 via NASAL

## 2022-05-22 MED ORDER — FAMOTIDINE (PF) 20 MG/2 ML INTRAVENOUS SOLUTION
INTRAVENOUS | Status: AC
Start: 2022-05-22 — End: 2022-05-22
  Filled 2022-05-22: qty 2

## 2022-05-22 MED ORDER — NALOXONE 0.4 MG/ML INJECTION SOLUTION
0.4000 mg | INTRAMUSCULAR | Status: DC | PRN
Start: 2022-05-22 — End: 2022-05-22

## 2022-05-22 MED ORDER — FENTANYL (PF) 50 MCG/ML INJECTION WRAPPER
50.0000 ug | INJECTION | INTRAMUSCULAR | Status: AC | PRN
Start: 2022-05-22 — End: 2022-05-22
  Administered 2022-05-22 (×4): 50 ug via INTRAVENOUS

## 2022-05-22 MED ORDER — DEXAMETHASONE SODIUM PHOSPHATE (PF) 10 MG/ML INJECTION SOLUTION
10.0000 mg | Freq: Once | INTRAMUSCULAR | Status: AC
Start: 2022-05-22 — End: 2022-05-22
  Administered 2022-05-22: 10 mg via INTRAVENOUS

## 2022-05-22 MED ORDER — FENTANYL (PF) 50 MCG/ML INJECTION SOLUTION
INTRAMUSCULAR | Status: AC
Start: 2022-05-22 — End: 2022-05-22
  Filled 2022-05-22: qty 2

## 2022-05-22 MED ORDER — ROPIVACAINE (PF) 2 MG/ML (0.2 %) INJECTION SOLUTION
Freq: Once | INTRAMUSCULAR | Status: DC | PRN
Start: 2022-05-22 — End: 2022-05-22
  Administered 2022-05-22: 30 mL via INTRA_ARTICULAR

## 2022-05-22 MED ORDER — SODIUM CHLORIDE 0.9 % (FLUSH) INJECTION SYRINGE
3.0000 mL | INJECTION | Freq: Three times a day (TID) | INTRAMUSCULAR | Status: DC
Start: 2022-05-22 — End: 2022-05-23
  Administered 2022-05-22 – 2022-05-23 (×3): 3 mL

## 2022-05-22 MED ORDER — CEFAZOLIN 1 GRAM SOLUTION FOR INJECTION
INTRAMUSCULAR | Status: AC
Start: 2022-05-22 — End: 2022-05-22
  Filled 2022-05-22: qty 10

## 2022-05-22 MED ORDER — APREPITANT 40 MG CAPSULE
40.0000 mg | ORAL_CAPSULE | Freq: Once | ORAL | Status: AC
Start: 2022-05-22 — End: 2022-05-22
  Administered 2022-05-22: 40 mg via ORAL

## 2022-05-22 MED ORDER — TRAMADOL 50 MG TABLET
50.0000 mg | ORAL_TABLET | Freq: Four times a day (QID) | ORAL | Status: DC
Start: 2022-05-22 — End: 2022-05-23
  Administered 2022-05-22 – 2022-05-23 (×3): 50 mg via ORAL
  Administered 2022-05-23: 0 mg via ORAL
  Filled 2022-05-22 (×3): qty 1

## 2022-05-22 MED ORDER — SODIUM CHLORIDE 0.9 % (FLUSH) INJECTION SYRINGE
3.0000 mL | INJECTION | Freq: Three times a day (TID) | INTRAMUSCULAR | Status: DC
Start: 2022-05-22 — End: 2022-05-22

## 2022-05-22 MED ORDER — SODIUM CHLORIDE 0.9 % INTRAVENOUS PIGGYBACK
INJECTION | INTRAVENOUS | Status: AC
Start: 2022-05-22 — End: 2022-05-22
  Filled 2022-05-22: qty 100

## 2022-05-22 MED ORDER — MORPHINE 2 MG/ML INJECTION WRAPPER
2.0000 mg | INJECTION | INTRAMUSCULAR | Status: DC | PRN
Start: 2022-05-22 — End: 2022-05-23

## 2022-05-22 MED ORDER — LIDOCAINE (PF) 100 MG/5 ML (2 %) INTRAVENOUS SYRINGE
INJECTION | Freq: Once | INTRAVENOUS | Status: DC | PRN
Start: 2022-05-22 — End: 2022-05-22
  Administered 2022-05-22: 50 mg via INTRAVENOUS

## 2022-05-22 MED ORDER — APREPITANT 40 MG CAPSULE
ORAL_CAPSULE | ORAL | Status: AC
Start: 2022-05-22 — End: 2022-05-22
  Filled 2022-05-22: qty 1

## 2022-05-22 MED ORDER — DEXAMETHASONE SODIUM PHOSPHATE (PF) 10 MG/ML INJECTION SOLUTION
8.0000 mg | Freq: Two times a day (BID) | INTRAMUSCULAR | Status: AC
Start: 2022-05-22 — End: 2022-05-23
  Administered 2022-05-22 – 2022-05-23 (×2): 8 mg via INTRAVENOUS
  Filled 2022-05-22 (×2): qty 1

## 2022-05-22 MED ORDER — MULTIVITAMIN-FERROUS FUMARATE-FOLIC ACID 18 MG-400 MCG TABLET
1.0000 | ORAL_TABLET | Freq: Every day | ORAL | Status: DC
Start: 2022-05-22 — End: 2022-05-23
  Administered 2022-05-22 – 2022-05-23 (×2): 1 via ORAL
  Filled 2022-05-22 (×2): qty 1

## 2022-05-22 MED ORDER — LACTATED RINGERS INTRAVENOUS SOLUTION
INTRAVENOUS | Status: DC
Start: 2022-05-22 — End: 2022-05-22

## 2022-05-22 MED ORDER — GABAPENTIN 300 MG CAPSULE
300.0000 mg | ORAL_CAPSULE | Freq: Once | ORAL | Status: AC
Start: 2022-05-22 — End: 2022-05-22
  Administered 2022-05-22: 300 mg via ORAL

## 2022-05-22 MED ORDER — CEFAZOLIN 1 GRAM SOLUTION FOR INJECTION
Freq: Once | INTRAMUSCULAR | Status: DC | PRN
Start: 2022-05-22 — End: 2022-05-22
  Administered 2022-05-22: 3000 mg via INTRAVENOUS

## 2022-05-22 MED ORDER — AMLODIPINE 10 MG TABLET
10.0000 mg | ORAL_TABLET | Freq: Every day | ORAL | Status: DC
Start: 2022-05-23 — End: 2022-05-23
  Administered 2022-05-23: 0 mg via ORAL
  Filled 2022-05-22: qty 1

## 2022-05-22 MED ORDER — ASPIRIN 81 MG CHEWABLE TABLET
81.0000 mg | CHEWABLE_TABLET | Freq: Every day | ORAL | Status: DC
Start: 2022-05-22 — End: 2022-05-22

## 2022-05-22 MED ORDER — BISACODYL 10 MG RECTAL SUPPOSITORY
10.0000 mg | Freq: Every day | RECTAL | Status: DC | PRN
Start: 2022-05-22 — End: 2022-05-23

## 2022-05-22 MED ORDER — MIDAZOLAM 5 MG/ML INJECTION WRAPPER
1.5000 mg | Freq: Once | INTRAMUSCULAR | Status: DC | PRN
Start: 2022-05-22 — End: 2022-05-22
  Administered 2022-05-22: 1.5 mg via INTRAVENOUS

## 2022-05-22 MED ORDER — SODIUM CHLORIDE 0.9 % (FLUSH) INJECTION SYRINGE
3.0000 mL | INJECTION | INTRAMUSCULAR | Status: DC | PRN
Start: 2022-05-22 — End: 2022-05-22

## 2022-05-22 MED ORDER — ACETAMINOPHEN 325 MG TABLET
975.0000 mg | ORAL_TABLET | Freq: Four times a day (QID) | ORAL | Status: AC
Start: 2022-05-22 — End: 2022-05-22
  Administered 2022-05-22 (×2): 975 mg via ORAL
  Filled 2022-05-22 (×2): qty 3

## 2022-05-22 MED ORDER — MIDAZOLAM 5 MG/ML INJECTION WRAPPER
INTRAMUSCULAR | Status: AC
Start: 2022-05-22 — End: 2022-05-22
  Filled 2022-05-22: qty 1

## 2022-05-22 MED ORDER — HYDROMORPHONE 2 MG/ML INJECTION WRAPPER
INJECTION | Freq: Once | INTRAMUSCULAR | Status: DC | PRN
Start: 2022-05-22 — End: 2022-05-22
  Administered 2022-05-22: .2 mg via INTRAVENOUS
  Administered 2022-05-22: .4 mg via INTRAVENOUS

## 2022-05-22 MED ORDER — CELECOXIB 100 MG CAPSULE
200.0000 mg | ORAL_CAPSULE | Freq: Once | ORAL | Status: AC
Start: 2022-05-22 — End: 2022-05-22
  Administered 2022-05-22: 200 mg via ORAL

## 2022-05-22 MED ORDER — ONDANSETRON HCL (PF) 4 MG/2 ML INJECTION SOLUTION
4.0000 mg | INTRAMUSCULAR | Status: DC | PRN
Start: 2022-05-22 — End: 2022-05-23

## 2022-05-22 MED ORDER — IPRATROPIUM 0.5 MG-ALBUTEROL 3 MG (2.5 MG BASE)/3 ML NEBULIZATION SOLN
3.0000 mL | INHALATION_SOLUTION | Freq: Once | RESPIRATORY_TRACT | Status: DC | PRN
Start: 2022-05-22 — End: 2022-05-22

## 2022-05-22 MED ORDER — KETOROLAC 30 MG/ML (1 ML) INJECTION SOLUTION
15.0000 mg | Freq: Three times a day (TID) | INTRAMUSCULAR | Status: DC
Start: 2022-05-22 — End: 2022-05-23
  Administered 2022-05-22 – 2022-05-23 (×2): 15 mg via INTRAVENOUS
  Filled 2022-05-22 (×2): qty 1

## 2022-05-22 MED ORDER — CELECOXIB 100 MG CAPSULE
ORAL_CAPSULE | ORAL | Status: AC
Start: 2022-05-22 — End: 2022-05-22
  Filled 2022-05-22: qty 2

## 2022-05-22 MED ORDER — FENTANYL (PF) 50 MCG/ML INJECTION WRAPPER
25.0000 ug | INJECTION | INTRAMUSCULAR | Status: DC | PRN
Start: 2022-05-22 — End: 2022-05-22

## 2022-05-22 MED ORDER — OXYCODONE ER 10 MG TABLET,CRUSH RESISTANT,EXTENDED RELEASE 12 HR
EXTENDED_RELEASE_ORAL_TABLET | ORAL | Status: AC
Start: 2022-05-22 — End: 2022-05-22
  Filled 2022-05-22: qty 1

## 2022-05-22 MED ORDER — SODIUM CHLORIDE 0.9 % (FLUSH) INJECTION SYRINGE
3.0000 mL | INJECTION | INTRAMUSCULAR | Status: DC | PRN
Start: 2022-05-22 — End: 2022-05-23

## 2022-05-22 MED ORDER — SERTRALINE 50 MG TABLET
50.0000 mg | ORAL_TABLET | Freq: Every day | ORAL | Status: DC
Start: 2022-05-22 — End: 2022-05-23
  Administered 2022-05-22 – 2022-05-23 (×2): 50 mg via ORAL
  Filled 2022-05-22 (×2): qty 1

## 2022-05-22 MED ORDER — MIDAZOLAM 5 MG/ML INJECTION WRAPPER
Freq: Once | INTRAMUSCULAR | Status: DC | PRN
Start: 2022-05-22 — End: 2022-05-22
  Administered 2022-05-22: 2 mg via INTRAVENOUS

## 2022-05-22 MED ORDER — DEXAMETHASONE SODIUM PHOSPHATE (PF) 10 MG/ML INJECTION SOLUTION
INTRAMUSCULAR | Status: AC
Start: 2022-05-22 — End: 2022-05-22
  Filled 2022-05-22: qty 1

## 2022-05-22 MED ORDER — PANTOPRAZOLE 40 MG TABLET,DELAYED RELEASE
40.0000 mg | DELAYED_RELEASE_TABLET | Freq: Every day | ORAL | Status: DC
Start: 2022-05-22 — End: 2022-05-23
  Administered 2022-05-22 – 2022-05-23 (×2): 40 mg via ORAL
  Filled 2022-05-22 (×2): qty 1

## 2022-05-22 MED ORDER — GABAPENTIN 300 MG CAPSULE
ORAL_CAPSULE | ORAL | Status: AC
Start: 2022-05-22 — End: 2022-05-22
  Filled 2022-05-22: qty 1

## 2022-05-22 MED ORDER — LISINOPRIL 20 MG TABLET
20.0000 mg | ORAL_TABLET | Freq: Every day | ORAL | Status: DC
Start: 2022-05-23 — End: 2022-05-23
  Administered 2022-05-23: 0 mg via ORAL
  Filled 2022-05-22: qty 1

## 2022-05-22 MED ORDER — ASPIRIN 81 MG CHEWABLE TABLET
81.0000 mg | CHEWABLE_TABLET | Freq: Two times a day (BID) | ORAL | Status: DC
Start: 2022-05-23 — End: 2022-05-23
  Administered 2022-05-23: 0 mg via ORAL
  Filled 2022-05-22: qty 1

## 2022-05-22 MED ORDER — TRIAMTERENE 37.5 MG-HYDROCHLOROTHIAZIDE 25 MG TABLET
1.0000 | ORAL_TABLET | Freq: Every day | ORAL | Status: DC
Start: 2022-05-23 — End: 2022-05-23
  Administered 2022-05-23: 0 via ORAL
  Filled 2022-05-22: qty 1

## 2022-05-22 MED ORDER — SUGAMMADEX 100 MG/ML INTRAVENOUS SOLUTION
Freq: Once | INTRAVENOUS | Status: DC | PRN
Start: 2022-05-22 — End: 2022-05-22
  Administered 2022-05-22: 300 mg via INTRAVENOUS

## 2022-05-22 MED ORDER — NALOXONE 0.4 MG/ML INJECTION SOLUTION
0.4000 mg | INTRAMUSCULAR | Status: DC | PRN
Start: 2022-05-22 — End: 2022-05-23

## 2022-05-22 MED ORDER — HYDROMORPHONE 2 MG/ML INJECTION WRAPPER
0.5000 mg | INJECTION | INTRAMUSCULAR | Status: DC | PRN
Start: 2022-05-22 — End: 2022-05-23

## 2022-05-22 MED ORDER — FAMOTIDINE 20 MG TABLET
20.0000 mg | ORAL_TABLET | Freq: Two times a day (BID) | ORAL | Status: DC
Start: 2022-05-22 — End: 2022-05-23
  Administered 2022-05-22 – 2022-05-23 (×2): 20 mg via ORAL
  Filled 2022-05-22 (×2): qty 1

## 2022-05-22 MED ORDER — EPHEDRINE SULFATE 50 MG/ML INTRAVENOUS SOLUTION
Freq: Once | INTRAVENOUS | Status: DC | PRN
Start: 2022-05-22 — End: 2022-05-22
  Administered 2022-05-22: 15 mg via INTRAVENOUS
  Administered 2022-05-22: 10 mg via INTRAVENOUS

## 2022-05-22 MED ORDER — GABAPENTIN 300 MG CAPSULE
600.0000 mg | ORAL_CAPSULE | Freq: Two times a day (BID) | ORAL | Status: DC
Start: 2022-05-22 — End: 2022-05-23
  Administered 2022-05-22 – 2022-05-23 (×2): 600 mg via ORAL
  Filled 2022-05-22 (×2): qty 2

## 2022-05-22 MED ORDER — ONDANSETRON HCL (PF) 4 MG/2 ML INJECTION SOLUTION
INTRAMUSCULAR | Status: AC
Start: 2022-05-22 — End: 2022-05-22
  Filled 2022-05-22: qty 4

## 2022-05-22 MED ORDER — LEVOTHYROXINE 150 MCG TABLET
150.0000 ug | ORAL_TABLET | Freq: Every morning | ORAL | Status: DC
Start: 2022-05-23 — End: 2022-05-23
  Administered 2022-05-23: 150 ug via ORAL
  Filled 2022-05-22: qty 1

## 2022-05-22 MED ORDER — LACTATED RINGERS INTRAVENOUS SOLUTION
INTRAVENOUS | Status: DC
Start: 2022-05-22 — End: 2022-05-22
  Administered 2022-05-22: 0 mL via INTRAVENOUS

## 2022-05-22 MED ORDER — FENTANYL (PF) 50 MCG/ML INJECTION WRAPPER
INJECTION | Freq: Once | INTRAMUSCULAR | Status: DC | PRN
Start: 2022-05-22 — End: 2022-05-22
  Administered 2022-05-22: 100 ug via INTRAVENOUS

## 2022-05-22 MED ORDER — ROCURONIUM 10 MG/ML INTRAVENOUS SOLUTION
Freq: Once | INTRAVENOUS | Status: DC | PRN
Start: 2022-05-22 — End: 2022-05-22
  Administered 2022-05-22: 40 mg via INTRAVENOUS
  Administered 2022-05-22: 10 mg via INTRAVENOUS

## 2022-05-22 MED ORDER — FERROUS SULFATE 324 MG (65 MG IRON) TABLET,DELAYED RELEASE
324.0000 mg | DELAYED_RELEASE_TABLET | Freq: Two times a day (BID) | ORAL | Status: DC
Start: 2022-05-22 — End: 2022-05-23
  Administered 2022-05-22 – 2022-05-23 (×2): 324 mg via ORAL
  Filled 2022-05-22 (×2): qty 1

## 2022-05-22 MED ORDER — ACETAMINOPHEN 325 MG TABLET
975.0000 mg | ORAL_TABLET | Freq: Once | ORAL | Status: AC
Start: 2022-05-22 — End: 2022-05-22
  Administered 2022-05-22: 975 mg via ORAL

## 2022-05-22 MED ORDER — HYDROCODONE 7.5 MG-ACETAMINOPHEN 325 MG TABLET
1.0000 | ORAL_TABLET | Freq: Four times a day (QID) | ORAL | 0 refills | Status: AC | PRN
Start: 2022-05-22 — End: ?

## 2022-05-22 MED ORDER — CEFAZOLIN 1 GRAM SOLUTION FOR INJECTION
INTRAMUSCULAR | Status: AC
Start: 2022-05-22 — End: 2022-05-22
  Filled 2022-05-22: qty 20

## 2022-05-22 MED ORDER — BETHANECHOL CHLORIDE 5 MG TABLET
5.0000 mg | ORAL_TABLET | Freq: Every day | ORAL | Status: DC
Start: 2022-05-22 — End: 2022-05-23
  Administered 2022-05-22: 0 mg via ORAL
  Administered 2022-05-23: 5 mg via ORAL

## 2022-05-22 MED ORDER — OXYCODONE 5 MG TABLET
5.0000 mg | ORAL_TABLET | ORAL | Status: DC | PRN
Start: 2022-05-22 — End: 2022-05-23
  Administered 2022-05-23: 5 mg via ORAL
  Filled 2022-05-22: qty 1

## 2022-05-22 MED ORDER — OXYCODONE ER 10 MG TABLET,CRUSH RESISTANT,EXTENDED RELEASE 12 HR
20.0000 mg | EXTENDED_RELEASE_ORAL_TABLET | Freq: Two times a day (BID) | ORAL | Status: DC
Start: 2022-05-22 — End: 2022-05-22
  Administered 2022-05-22: 20 mg via ORAL

## 2022-05-22 MED ORDER — FAMOTIDINE (PF) 20 MG/2 ML INTRAVENOUS SOLUTION
20.0000 mg | Freq: Once | INTRAVENOUS | Status: AC
Start: 2022-05-22 — End: 2022-05-22
  Administered 2022-05-22: 20 mg via INTRAVENOUS

## 2022-05-22 MED ORDER — OXYCODONE ER 10 MG TABLET,CRUSH RESISTANT,EXTENDED RELEASE 12 HR
10.0000 mg | EXTENDED_RELEASE_ORAL_TABLET | Freq: Once | ORAL | Status: AC
Start: 2022-05-22 — End: 2022-05-22
  Administered 2022-05-22: 10 mg via ORAL
  Filled 2022-05-22: qty 1

## 2022-05-22 MED ORDER — CELECOXIB 100 MG CAPSULE
200.0000 mg | ORAL_CAPSULE | Freq: Two times a day (BID) | ORAL | Status: DC
Start: 2022-05-25 — End: 2022-05-23

## 2022-05-22 MED ORDER — ACETAMINOPHEN 325 MG TABLET
650.0000 mg | ORAL_TABLET | ORAL | Status: DC | PRN
Start: 2022-05-22 — End: 2022-05-23

## 2022-05-22 MED ORDER — ACETAMINOPHEN 325 MG TABLET
ORAL_TABLET | ORAL | Status: AC
Start: 2022-05-22 — End: 2022-05-22
  Filled 2022-05-22: qty 3

## 2022-05-22 MED ORDER — CELECOXIB 100 MG CAPSULE
200.0000 mg | ORAL_CAPSULE | Freq: Two times a day (BID) | ORAL | Status: DC
Start: 2022-05-24 — End: 2022-05-22

## 2022-05-22 MED ORDER — ONDANSETRON HCL (PF) 4 MG/2 ML INJECTION SOLUTION
8.0000 mg | Freq: Once | INTRAMUSCULAR | Status: AC
Start: 2022-05-22 — End: 2022-05-22
  Administered 2022-05-22: 8 mg via INTRAVENOUS

## 2022-05-22 MED ORDER — ASPIRIN 81 MG TABLET,DELAYED RELEASE
81.0000 mg | DELAYED_RELEASE_TABLET | Freq: Two times a day (BID) | ORAL | Status: AC
Start: 2022-05-22 — End: ?

## 2022-05-22 MED ORDER — POTASSIUM CHLORIDE 20 MEQ/L IN 0.9 % SODIUM CHLORIDE INTRAVENOUS
INTRAVENOUS | Status: DC
Start: 2022-05-22 — End: 2022-05-23
  Administered 2022-05-22: 0 mL/h via INTRAVENOUS

## 2022-05-22 MED ORDER — ALBUTEROL SULFATE 2.5 MG/3 ML (0.083 %) SOLUTION FOR NEBULIZATION
2.5000 mg | INHALATION_SOLUTION | Freq: Once | RESPIRATORY_TRACT | Status: DC | PRN
Start: 2022-05-22 — End: 2022-05-22

## 2022-05-22 MED ORDER — ONDANSETRON HCL (PF) 4 MG/2 ML INJECTION SOLUTION
4.0000 mg | Freq: Once | INTRAMUSCULAR | Status: DC | PRN
Start: 2022-05-22 — End: 2022-05-22

## 2022-05-22 MED ORDER — OXYCODONE 5 MG TABLET
10.0000 mg | ORAL_TABLET | ORAL | Status: DC | PRN
Start: 2022-05-22 — End: 2022-05-23

## 2022-05-22 SURGICAL SUPPLY — 130 items
ABSORBENT FLOOR MAT ROLL WTRPRF BACKSHEET (MED SURG SUPPLIES) ×1
ADH SKNCLS CYNCRLT SKINSTITCH LIQUID PREC APPL TIP NONST LF  DISP .5ML (SUTURE/WOUND CLOSURE) ×1 IMPLANT
ADH SKNCLS GLU .5ML (SUTURE/WOUND CLOSURE) ×1
BANDAGE ELT 5.8YDX4IN NONST SLFCLS ELAS KNIT TEAL END STCH (WOUND CARE/ENTEROSTOMAL SUPPLY) ×1
BANDAGE ELT 5.8YDX4IN NONST SLFCLS ELAS KNIT TEAL END STCH VELCRO COTTON POLY BLND STD LGTH COMPRESS (WOUND CARE SUPPLY) ×1 IMPLANT
BANDAGE ELT 5.8YDX6IN NONST SLFCLS ELAS KNIT STRCH VELCRO (WOUND CARE/ENTEROSTOMAL SUPPLY) ×1
BANDAGE ELT 5.8YDX6IN NONST SLFCLS ELAS KNIT STRCH VELCRO COTTON POLY BLND STD LGTH COMPRESS BGE HNY (WOUND CARE SUPPLY) ×1 IMPLANT
BASEPLATE TIB TRIATH 6 KNEE TRIT (IMPLANTS KNEE) ×1 IMPLANT
BLADE 10 2 END CBNSTL SURG STRL DISP (SURGICAL CUTTING SUPPLIES) ×5 IMPLANT
BLADE NRW NRW SGTL MICS SURG STRL DISP (SURGICAL CUTTING SUPPLIES) IMPLANT
BLADE NRW SGTL MICS SURG DISP (CUTTING ELEMENTS)
BLADE SAW 90X25X1.27MM SGTL STRL LF (SURGICAL CUTTING SUPPLIES) ×1 IMPLANT
BLADE SAW STD MAKO STRL LF  DISP (SURGICAL CUTTING SUPPLIES) ×1 IMPLANT
BLADE SAW STD MAKO STRL LF DISP (CUTTING ELEMENTS) ×1
BLADE SURG CLPR PVT ADJ HEAD 9661 STRL LF  DISP PURP (MED SURG SUPPLIES) IMPLANT
BLADE SURG CLPR PVT ADJ HEAD 9661 STRL LF DISP PURP (MED SURG SUPPLIES)
BONE CEM MXVC3 HI VACU SPAT TU_BE UNIV SYSTEM STRL LF GRY (ORTHOPEDICS (NOT IMPLANTS))
CLEANER INSTR PREPZYME MUL-TRD CONTAINR NARSL NEUT PH BDGR (MISCELLANEOUS PT CARE ITEMS) ×1
CONTAINER SP CLIKSEAL 120CC_01063 STRL 300EA/CS (MISCELLANEOUS PT CARE ITEMS)
CONTAINR CLICKSEAL 4OZ TRANSLUC SCREW CAP STRL BLU SPECI PNEUM TUBE SYS (MISCELLANEOUS PT CARE ITEMS) IMPLANT
CONV USE 31829 - NEEDLE HYPO  18GA 1.5IN STD REG BVL LF (MED SURG SUPPLIES) ×1 IMPLANT
CONV USE ITEM 321837 - GLOVE SURG 7.5 LTX PF NONST CRM (GLOVES AND ACCESSORIES) IMPLANT
CONV USE ITEM 321863 - GLOVE SURG 6.5 LF PF SMOOTH STRL GRN  PLISPRN MICRO (GLOVES AND ACCESSORIES) ×2 IMPLANT
CONV USE ITEM 321982 - GLOVE SURG 7 LTX CHEMO PF SMOOTH BEAD CUF STRL WHT 11.6IN PLMR THK.2MM THK.21MM (GLOVES AND ACCESSORIES) IMPLANT
CONV USE ITEM 321983 - GLOVE SURG 8 LTX CHEMO PF SMOOTH BEAD CUF STRL WHT 11.6IN PLMR THK.2MM THK.21MM (GLOVES AND ACCESSORIES) ×1 IMPLANT
CONV USE ITEM 329146 - CLEANER INSTR PREPZYME MUL-TRD CONTAINR NARSL NEUT PH BDGR 22OZ (MISCELLANEOUS PT CARE ITEMS) ×1 IMPLANT
CONV USE ITEM 338662 - PACK SURG ASCP STRL DISP ~~LOC~~ BPT MED CNTR LF (CUSTOM TRAYS & PACK) ×1 IMPLANT
CONV USE ITEM 34153 - ELECTRODE ESURG BLADE PNCL 3/32IN STRL SS CAUT PSHBTN STD SHAFT LF  VEGA SER (SURGICAL CUTTING SUPPLIES) ×1 IMPLANT
CORD RETRACT SILI (DENTAL PRODUCTS) IMPLANT
COUNTER 20 CNT BLOCK ADH NEEDLE STRL LF  RD SHARP FOAM 15.75X11.5X14IN DISP (MED SURG SUPPLIES) ×1 IMPLANT
COUNTER 20 CNT BLOCK ADH NEEDLE STRL LF RD SHARP FOAM 15.75 (MED SURG SUPPLIES) ×1
COVER TBL 90X50IN STD SMS REINF FNFLD STRL LF  DISP (DRAPE/PACKS/SHEETS/OR TOWEL) ×4 IMPLANT
COVER TBL 90X50IN STD SMS REINF FNFLD STRL LF DISP (DRAPE/PACKS/SHEETS/OR TOWEL) ×4
DEVICE CLSR STRATAFIX PDS + 18IN ABS KNOTLESS TISS CONTROL (SUTURE/WOUND CLOSURE) ×1
DEVICE SUCT 2 FILTER CHAMBER SCKR STRL LF  DISP (MED SURG SUPPLIES) ×1 IMPLANT
DEVICE SUT STRATAFIX PDS + 45C_M ABS KNTLS TISS CONTROL SMTR (SUTURE/WOUND CLOSURE) ×1
DISCONTINUED NO SUB - DRESS TRNSPR 2.75X2.375IN FILM IV STRL LF (WOUND CARE SUPPLY) IMPLANT
DRAPE 2 INCS FILM ANTIMIC 23X2_3IN IOBN STRL SURG (DRAPE/PACKS/SHEETS/OR TOWEL) ×2
DRAPE FNFLD ABS REINF 77X53IN 43528 PRXM LF  STRL DISP SURG SMS 44X23IN (DRAPE/PACKS/SHEETS/OR TOWEL) ×2 IMPLANT
DRAPE FNFLD ABS REINF 77X53IN_43528 PRXM LF STRL DISP SURG (DRAPE/PACKS/SHEETS/OR TOWEL) ×2
DRAPE INCS ANTIMIC 23X23IN IOBN2 TRNSPR (DRAPE/PACKS/SHEETS/OR TOWEL) ×2 IMPLANT
DRESS 12X4IN PS MPLX BR FOAM ACUTE SURG WOUND (WOUND CARE SUPPLY) ×1 IMPLANT
DRESS COMPRESS 13FTX6IN JNS COTTON RL HI ABS LF  STRL .5LB (WOUND CARE SUPPLY) ×1 IMPLANT
DRESS COMPRESS 13FTX6IN JNS COTTON RL HI ABS LF STRL .5LB (WOUND CARE/ENTEROSTOMAL SUPPLY) ×1
DRESS TRNSPR 2.75X2.375IN FILM IV STRL LF (WOUND CARE/ENTEROSTOMAL SUPPLY)
DURAPREP 26ML 8630 CS/20 (MED SURG SUPPLIES) ×1
ELECTRODE ESURG BLADE PNCL 3/32IN STRL SS CAUT PSHBTN STD (CUTTING ELEMENTS) ×1
FEM 6 PA CRCTE RTN BEAD TRIATH KNEE LFT COM STRL LF (IMPLANTS KNEE) ×1 IMPLANT
GLOVE SURG 6 LF  PF BEAD CUF STRL CRM 11.3IN PROTEXIS PI (GLOVES AND ACCESSORIES)
GLOVE SURG 6 LF  PF BEAD CUF STRL CRM 11.3IN PROTEXIS PLISPRN THK9.1 MIL (GLOVES AND ACCESSORIES) IMPLANT
GLOVE SURG 6 LF  PF SMOOTH BEAD CUF STRL GRN 12IN SENSICARE PI PLISPRN PLMR ALOE THK7.9 MIL DISP (GLOVES AND ACCESSORIES) IMPLANT
GLOVE SURG 6 LF PF SMOOTH BEAD CUF STRL GRN 12IN SENSICARE (GLOVES AND ACCESSORIES)
GLOVE SURG 6.5 LF  PF BEAD CUF STRL CRM 11.3IN PROTEXIS PI (GLOVES AND ACCESSORIES) ×2
GLOVE SURG 6.5 LF  PF BEAD CUF STRL CRM 11.3IN PROTEXIS PI PLISPRN THK9.1 MIL (GLOVES AND ACCESSORIES) ×2 IMPLANT
GLOVE SURG 6.5 LTX PF BEAD CUF MICRO ROUGHEN N-PYRG STRL STRW BGL SRG CURVE FINGER (GLOVES AND ACCESSORIES) IMPLANT
GLOVE SURG 6.5 LTX PF BEAD CUF_MICRO RGH N-PYRG STRL STRW (GLOVES AND ACCESSORIES)
GLOVE SURG 7 LF  PF BEAD CUF STRL CRM 11.8IN PROTEXIS PI (GLOVES AND ACCESSORIES)
GLOVE SURG 7 LF  PF BEAD CUF STRL CRM 11.8IN PROTEXIS PI PLISPRN THK9.1 MIL (GLOVES AND ACCESSORIES) IMPLANT
GLOVE SURG 7 LF  PF SMOOTH TXTR BEAD CUF STRL GRN 12IN SENSICARE PI PLISPRN SYN PLMR ALOE THK7.9 MIL (GLOVES AND ACCESSORIES) ×1 IMPLANT
GLOVE SURG 7 LF PF SMOOTH TXTR BEAD CUF STRL GRN 12IN (GLOVES AND ACCESSORIES) ×1
GLOVE SURG 7 LTX PF SMOOTH STRL CRM (GLOVES AND ACCESSORIES)
GLOVE SURG 7.5 LF  PF BEAD CUF STRL CRM 11.8IN PROTEXIS PI (GLOVES AND ACCESSORIES) ×1
GLOVE SURG 7.5 LF  PF BEAD CUF STRL CRM 11.8IN PROTEXIS PI PLISPRN THK9.1 MIL (GLOVES AND ACCESSORIES) ×1 IMPLANT
GLOVE SURG 7.5 LF  PF SMOOTH BEAD CUF STRL GRN 12IN SENSICARE PI GRN PLISPRN PLMR ALOE THK7.9 MIL (GLOVES AND ACCESSORIES) IMPLANT
GLOVE SURG 7.5 LF PF SMOOTH BEAD CUF STRL GRN 12IN (GLOVES AND ACCESSORIES)
GLOVE SURG 7.5 LTX PF SMOOTH STRL CRM (GLOVES AND ACCESSORIES)
GLOVE SURG 8 LF  PF BEAD CUF SMOOTH TXTR STRL GRN 12IN SENSICARE PLISPRN SYN PLMR ALOE THK7.9 MIL (GLOVES AND ACCESSORIES) IMPLANT
GLOVE SURG 8 LF  PF BEAD CUF STRL CRM 11.8IN PROTEXIS PI PLISPRN THK9.1 MIL (GLOVES AND ACCESSORIES) ×1 IMPLANT
GLOVE SURG 8 LF PF BEAD CUF SMOOTH TXTR STRL GRN 12IN (GLOVES AND ACCESSORIES)
GLOVE SURG 8 LF PF BEAD CUF STRL CRM 11.8IN PROTEXIS PI (GLOVES AND ACCESSORIES) ×1
GLOVE SURG 8 LTX PF SMOOTH STRL CRM (GLOVES AND ACCESSORIES) ×1
GLOVE SURG 8.5 LF  PF BEAD CUF STRL CRM 11.8IN PROTEXIS PI PLISPRN THK9.1 MIL (GLOVES AND ACCESSORIES) IMPLANT
GLOVE SURG 8.5 LF  PF SMOOTH BEAD CUF INTLK STRL BLU 11.8IN (GLOVES AND ACCESSORIES) ×1
GLOVE SURG 8.5 LF  PF SMOOTH BEAD CUF INTLK STRL BLU 11.8IN PROTEXIS NEU-THERA PLISPRN THK7.9 MIL (GLOVES AND ACCESSORIES) ×1 IMPLANT
GLOVE SURG 8.5 LF PF BEAD CUF STRL CRM 11.8IN PROTEXIS PI (GLOVES AND ACCESSORIES)
GOWN SURG XL STD LGTH L3 HKLP CLSR RGLN SLEEVE TWL STRL LF (DRAPE/PACKS/SHEETS/OR TOWEL) ×1
GOWN SURG XL STD LGTH L3 HKLP CLSR RGLN SLEEVE TWL STRL LF  DISP GRN AERO BLU PRFRM FBRC (DRAPE/PACKS/SHEETS/OR TOWEL) ×1 IMPLANT
GOWN SURG XL XLNG L4 REINF HKLP CLSR SET IN SLEEVE STRL LF (DRAPE/PACKS/SHEETS/OR TOWEL) ×2
GOWN SURG XL XLNG L4 REINF HKLP CLSR SET IN SLEEVE STRL LF  DISP BLU SIRUS SMS PE 56IN (DRAPE/PACKS/SHEETS/OR TOWEL) ×2 IMPLANT
HANDPC PLS LAV INTPLS SUCT FAN SPRAY TIP (MED SURG SUPPLIES) ×1
HEMOSTAT ABS 14X2IN FLXB SHR W_V SRGCL STRL DISP (WOUND CARE SUPPLY) ×1 IMPLANT
INSERT TIB BRNG CRCTE RTN 6 10MM TRIATH X3 KNEE STRL LF (IMPLANTS KNEE) ×1 IMPLANT
KIT 1 PC PCKT DRP RIO RIO (DRAPE/PACKS/SHEETS/OR TOWEL) ×1 IMPLANT
KIT DRP PCKT RIO (DRAPE/PACKS/SHEETS/OR TOWEL) ×1
LABEL MED CORRECT MED LABELING SYS 4 FLG 2 SHEET 24 PRPRNT (MED SURG SUPPLIES) ×1
LABEL MED CORRECT MED LABELING SYS 4 FLG 2 SHEET 24 PRPRNT STRL (MED SURG SUPPLIES) ×1 IMPLANT
MARKER SURG CKPNT KIT STRL LF  DISP (MED SURG SUPPLIES) ×1 IMPLANT
MARKER SURG CKPNT KIT STRL LF_DISP (MED SURG SUPPLIES) ×1
MAT FLR 110X29IN STD ABSRBENT WATERPROF BACKSHEET RL (MED SURG SUPPLIES) ×1 IMPLANT
MATTRESS TRANSF 34IN HOVERMATT BRTHBL NONST LF  DISP (MED SURG SUPPLIES) ×1 IMPLANT
MATTRESS TRANSF 34IN HOVERMATT_BRTHBL NONST LF DISP (MED SURG SUPPLIES) ×1
MIXER BONE CEM MXVC3 STRL LF DISP (ORTHOPEDICS (NOT IMPLANTS)) IMPLANT
NAVIGATE VIZADISC KNEE TRK KIT SYSTEM STRL LF  DISP (MED SURG SUPPLIES) ×1 IMPLANT
NAVIGATE VIZADISC KNEE TRK KT_SYSTEM (MED SURG SUPPLIES) ×1
NEEDLE 1.5IN 18GA POLYPROP FIL LL HUB DEHP-FR STRL BLUNT BD REG WL LF  DISP RD (MED SURG SUPPLIES) ×1 IMPLANT
NEEDLE 1.5IN 18GA POLYPROP FIL_LL HUB DEHP-FR STRL BLUNT BD (MED SURG SUPPLIES) ×1
NEEDLE HYPO 18GA 1.5IN STD RE_G BVL LF (MED SURG SUPPLIES) ×1
PACK ARTHROGRAM MDLN INDUSTRIES INC. RAD GEN N/M S DISP (CUSTOM TRAYS & PACK) ×1
PACK SURG ASCP STRL DISP ~~LOC~~ BPT MED CNTR LF (CUSTOM TRAYS & PACK) ×1
PATEL 10MM 35MM TRIT METAL ASYM TRIATH KNEE COM STRL (IMPLANTS KNEE) ×1 IMPLANT
PEN SURG MRKNG DISP RLR LBL STRL LF  6IN (MED SURG SUPPLIES) IMPLANT
PEN SURG MRKNG DISP RLR LBL STRL LF 6IN (MED SURG SUPPLIES)
PIN FIX 110MM 4MM BONE STRL (IMPLANTS TRAUMA) IMPLANT
PIN FIX 140MM 4MM STR KNEE STRL (MED SURG SUPPLIES) ×1 IMPLANT
PIN FIX 140MM 4MM STR KNEE STR_L (MED SURG SUPPLIES) ×1
POSITION OR LEG KIT DISP (MED SURG SUPPLIES) IMPLANT
SEALER ESURG .226IN .137IN AQUAMANTYS 6 30D .236IN SPC BIPOLAR 2 ELECTRODE HMST 5.1IN STRL LF  DISP (SURGICAL CUTTING SUPPLIES) ×1 IMPLANT
SEALER ESURG .226IN .137IN AQU_AMANTYS 6 TRANSCOLLATION 30D (CUTTING ELEMENTS) ×1
SET INTPLS SUCT TUBE FAN SPRAY TIP HANDPC STRL LF  DISP (MED SURG SUPPLIES) ×1 IMPLANT
SOL IRRG 0.9% NACL 2000ML PRSV FR N-PYRG FLXB CONTAINR STRL (MEDICATIONS/SOLUTIONS) ×1
SOL IRRG 0.9% NACL 2000ML PRSV FR N-PYRG FLXB CONTAINR STRL LF (MEDICATIONS/SOLUTIONS) ×1 IMPLANT
SOL IV 0.9% NACL 500ML PLASTIC CONTAINR VIAFLEX LF (MEDICATIONS/SOLUTIONS) ×1 IMPLANT
SOL SURG PREP 26ML DRPRP 74% ISPRP 0.7% IOD POVACRYLEX SLF CNTN APPL SKIN STRL PREOP (MED SURG SUPPLIES) ×1 IMPLANT
SOLUTION IV NS INJ 500CC_2B1323Q 24/CS (MEDICATIONS/SOLUTIONS) ×1
SPONGE LAP 18X18IN PREWASH RIGID TRY STRL LF  WHT (MED SURG SUPPLIES) ×2 IMPLANT
SPONGE LAP 18X18IN PREWASH RIGID TRY STRL LF WHT (MED SURG SUPPLIES) ×2
STKNT ORTHO 48X12IN MDCHC IMPRV MOIST RPLNT BARRIER LF  STRL (ORTHOPEDICS (NOT IMPLANTS)) IMPLANT
SUTURE 1 CT STRATAFIX PDS + 18IN VIOL ABS KNOTLESS TISS CONTROL SMTR ABS (SUTURE/WOUND CLOSURE) ×1 IMPLANT
SUTURE 1 CT VICRYL 36IN VIOL BRD COAT ABS (SUTURE/WOUND CLOSURE) ×4 IMPLANT
SUTURE 1 CT VICRYL 36IN VIOL B_RD COAT ABS (SUTURE/WOUND CLOSURE) ×4
SUTURE 2-0 CT1 STRATAFIX PDS + 18IN VIOL ABS KNOTLESS TISS CONTROL SMTR ABS (SUTURE/WOUND CLOSURE) ×1 IMPLANT
SUTURE 2-0 GS-21 POLYSRB 30IN UNDYED BRD COAT ABS (SUTURE/WOUND CLOSURE) ×2 IMPLANT
SUTURE 2-0 GS-21 POLYSRB_30IN BRD COAT ABS (SUTURE/WOUND CLOSURE) ×2
SUTURE 3-0 C-13 BIOSYN 30IN UNDYED MONOF ABS (SUTURE/WOUND CLOSURE) ×1 IMPLANT
SUTURE 4-0 C-14 SURGIPRO2 18IN BLU MONOF NONAB (SUTURE/WOUND CLOSURE) ×2 IMPLANT
SYRINGE LL 30ML LF  STRL CONCEN TIP GRAD N-PYRG DEHP-FR MED DISP (MED SURG SUPPLIES) ×1 IMPLANT
SYRINGE LL 30ML LF STRL CONCE_N TIP GRAD N-PYRG DEHP-FR MED (MED SURG SUPPLIES) ×1
TOWEL 24X16IN COTTON BLU DISP SURG STRL LF (DRAPE/PACKS/SHEETS/OR TOWEL) ×4 IMPLANT
WOUND IRRG IRRISEPT DBRD CLNSG 0.05% CHG SYSTEM STRL LF (WOUND CARE SUPPLY) ×1 IMPLANT
WOUND IRRG IRRISEPT DBRD CLNSG_0.05% CHG SYSTEM STRL LF (WOUND CARE/ENTEROSTOMAL SUPPLY) ×1

## 2022-05-22 NOTE — Anesthesia Preprocedure Evaluation (Addendum)
ANESTHESIA PRE-OP EVALUATION  Planned Procedure: MAKO ROBOTIC ASSISTED LEFT TOTAL KNEE ARTHROPLASTY USING TRIATHLON IMPLANTS (Left: Knee)  Review of Systems     anesthesia history negative     patient summary reviewed  nursing notes reviewed        Pulmonary   sleep apnea,   Cardiovascular    Hypertension and ECG reviewed ,No peripheral edema,  Exercise Tolerance: > or = 4 METS        GI/Hepatic/Renal    GERD        Endo/Other    hypothyroidism and obesity,      Neuro/Psych/MS   negative neuro/psych ROS,      Cancer    negative hematology/oncology ROS,               Physical Assessment      Airway       Mallampati: III    TM distance: <3 FB    Mouth Opening: fair.            Dental                    Pulmonary    Breath sounds clear to auscultation  (-) no rhonchi, no decreased breath sounds, no wheezes, no rales and no stridor     Cardiovascular    Rhythm: regular  Rate: Normal  (-) no friction rub, carotid bruit is not present, no peripheral edema and no murmur     Other findings          Plan  ASA 3     Planned anesthesia type: general                           Anesthesia issues/risks discussed are: Dental Injuries, Stroke, Sore Throat, Cardiac Events/MI, Eye /Visual Loss, Nerve Injuries, PONV, Intraoperative Awareness/ Recall, Blood Loss, Aspiration and Difficult Airway.  Anesthetic plan and risks discussed with patient  signed consent obtained            Patient's NPO status is appropriate for Anesthesia.           Plan discussed with CRNA.    (Patient has scratch marks at his lumbar area that is raised and red. Anesthesia plan changed from spinal to GETA.)

## 2022-05-22 NOTE — OR Nursing (Signed)
PRE OP MEASURE      LT  AK 22"          BK  18 1/4"

## 2022-05-22 NOTE — Anesthesia Postprocedure Evaluation (Signed)
Anesthesia Post Op Evaluation    Patient: Steven Juarez  Procedure(s):  MAKO ROBOTIC ASSISTED LEFT TOTAL KNEE ARTHROPLASTY USING PRESSFIT TRIATHLON IMPLANTS    Last Vitals:Temperature: 36.2 C (97.1 F) (05/22/22 1406)  Heart Rate: 99 (05/22/22 1435)  BP (Non-Invasive): 129/78 (05/22/22 1435)  Respiratory Rate: 14 (05/22/22 1435)  SpO2: 94 % (05/22/22 1435)    There were no known notable events for this encounter.    Patient is sufficiently recovered from the effects of anesthesia to participate in the evaluation and has returned to their pre-procedure level.  Patient location during evaluation: PACU       Patient participation: complete - patient participated  Level of consciousness: awake and alert and responsive to verbal stimuli    Pain score: 4  Pain management: adequate  Airway patency: patent    Anesthetic complications: no  Cardiovascular status: acceptable  Respiratory status: acceptable  Hydration status: acceptable  Patient post-procedure temperature: Pt Normothermic   PONV Status: Absent

## 2022-05-22 NOTE — OR Surgeon (Signed)
[facility]  Operative Note     PATIENT NAME:  Steven Juarez  MRN:  [id number]  DOB:  [birth year]    Date of Procedure:  05/22/2022  Preoperative Diagnosis: OSTEOARTHRITIS LEFT KNEE   Postoperative Diagnoses:  OSTEOARTHRITIS LEFT KNEE   Procedure Performed: ProcProcedure(s) (LRB):  MAKO ROBOTIC ASSISTED LEFT TOTAL KNEE ARTHROPLASTY USING PRESSFIT TRIATHLON IMPLANTS (Left)   Implants:  Implant Name Type Inv. Item Serial No. Manufacturer Lot No. LRB No. Used Action   FEM 6 PA CRCTE RTN BEAD RGT_TRIA KN COM - [id number]  FEM 6 PA CRCTE RTN BEAD RGT_TRIA KN COM  [employer] [id number] Left 1 Implanted   INSERT TIB BRNG CRCTE RTN 6_10MM TRIATH KNEE STRL - [id number]  INSERT TIB BRNG CRCTE RTN 6_10MM TRIATH KNEE STRL  STRYKER [id number] Left 1 Implanted   BASEPLATE TIB TRIATH 6 KNEE TR_IT - [id number]  BASEPLATE TIB TRIATH 6 KNEE TR_IT  [employer] [id number] Left 1 Implanted   PATEL  MTL ASYM TRIT_KN COM - [id number]  PATEL  MTL ASYM TRIT_KN COM  [employer] [id number] Left 1 Implanted      [employer]    Surgeon: Alexia Freestone, MD   Anesthesia: Anesthesiologist: Brita Romp, MD  CRNA: Tonette Lederer, CRNA   OR Staff: Circulator: Octavio Graves, RN  PERIOPERATIVE CARE ASSISTANT: Selinda Eon, PCA  Scrub Person: Almyra Free, ST  Scrub First Assist: Doreene Adas, CST   Anticoagulation: ASA   Antibiotics:  Kefzol  Estimated Blood Loss: * No blood loss amount entered *   Specimens: * No specimens in log *   Complications: None immediate  Indications For Procedure:  Steven Juarez  is a very pleasant  66 y.o. male  presenting  for OSTEOARTHRITIS LEFT KNEE   End stage arthritis interfering with ADL and failing multiple non-surgical treatments.  Discussed robotic technology, thromoboembolism, infection and risks, benefits, indications, and complications of procedure. Informed signed consent obtained.  ROBOTIC DEFORMITY AND MOTION:   PRE:  7 degree varus  ROM  -3-110   POST:  0 deg varus ROM 0-137  DESCRIPTION OF THE PROCEDURE:  Patient identified and time out completed with entire surgical team.  Antibiotics and tranexamic acid administered prior to inflation of torniquet.  Satisfactory anesthesia obtained. Tourniquet was placed over padding on the thigh.   The leg was prepped with DuraPrep and draped in a sterile manner.  Elevation exsanguination, tourniquet to 350 mmHg.    Anterior approach to the knee through medial parapatellar arthrotomy, the knee exposed.  We placed the femoral array inside the incision.  Tibial array outside. The femoral and tibial markers were placed and we successfully registered the knee. The robotic plan for bone cuts and balancing completed in flexion and extension.  The robot was then utilized to perform all cuts.  Posterior osteophytes were removed from the femur.   Tibial component rotation verified to match robotic plan.  Patella was resected at the reflection of the patellar ligament.   Trials were positioned with excellent extension and flexion with good rotation, alignment and stability verified robotically. Drill holes for tibial pegs completed. Tibial component implanted, followed by femoral component.  Both fit well. The patellar component was implanted and the component was verified to be well fixed.  Patellar tracking was evaluated and found to be satisfactory. Knee was copiously irrigated with pulsatile lavage, then one-minute soak with Irrisept solution.  T.  The knee then taken through range of motion with  full extension and flexion with good rotation, alignment and stability, balance and ROM again verified with MAKO robotics.    Wound was irrigated.  The arthrotomy was closed in interrupted fashion.  The wound was closed in layers finishing with subcuticular stitch on the skin.  Sterile bandage was applied, and the tourniquet was released.  The patient was awoken from anesthesia and taken to recovery room under the supervision  of anesthesia.  Alexia Freestone, MD

## 2022-05-22 NOTE — Anesthesia Transfer of Care (Signed)
ANESTHESIA TRANSFER OF CARE   Steven Juarez is a 66 y.o. ,male, Weight: 120 kg (265 lb)   had Procedure(s):  MAKO ROBOTIC ASSISTED LEFT TOTAL KNEE ARTHROPLASTY USING PRESSFIT TRIATHLON IMPLANTS  performed  05/22/22   Primary Service: Alexia Freestone, MD    Past Medical History:   Diagnosis Date    Esophageal reflux     Hypercholesterolemia     Hypertension     Sleep apnea       Allergy History as of 05/22/22       IODINATED CONTRAST MEDIA         Noted Status Severity Type Reaction    05/22/22 0715 Duffy, Misty Stanley, CPhT 01/11/13 Active Medium  Swelling              CRAB         Noted Status Severity Type Reaction    05/22/22 0715 Duffy, Misty Stanley, CPhT 05/22/22 Active High  Anaphylaxis                  I completed my transfer of care / handoff to the receiving personnel during which we discussed:  Airway, All key/critical aspects of case discussed, Analgesia, Fluids/Product, Gave opportunity for questions and acknowledgement of understanding and PMHx                              Additional Info:Pt awakens easily, breathing easily, back of bed elevated, vss, report to pacu rn to include history of OSA/cpap use.                                     Last OR Temp: Temperature: 36.2 C (97.1 F)  ABG:  POTASSIUM   Date Value Ref Range Status   05/03/2022 4.2 3.5 - 5.1 mmol/L Final     KETONES   Date Value Ref Range Status   05/03/2022 Negative Negative, Trace mg/dL Final     CALCIUM   Date Value Ref Range Status   05/03/2022 9.5 8.6 - 10.3 mg/dL Final     Calculated P Axis   Date Value Ref Range Status   05/03/2022 64 degrees Final     Calculated R Axis   Date Value Ref Range Status   05/03/2022 116 degrees Final     Calculated T Axis   Date Value Ref Range Status   05/03/2022 20 degrees Final     Airway:  EndoTracheal Tube (Active)     Blood pressure 125/78, pulse 97, temperature 36.2 C (97.1 F), resp. rate 15, height 1.88 m (6\' 2" ), weight 120 kg (265 lb), SpO2 99%.

## 2022-05-23 LAB — CBC WITH DIFF
BASOPHIL #: 0 10*3/uL (ref 0.00–0.10)
BASOPHIL %: 0 % (ref 0–1)
EOSINOPHIL #: 0 10*3/uL (ref 0.00–0.50)
EOSINOPHIL %: 0 % — ABNORMAL LOW
HCT: 38 % (ref 36.7–47.1)
HGB: 13 g/dL (ref 12.5–16.3)
LYMPHOCYTE #: 0.7 10*3/uL — ABNORMAL LOW (ref 1.00–3.00)
LYMPHOCYTE %: 5 % — ABNORMAL LOW (ref 16–44)
MCH: 30.6 pg (ref 23.8–33.4)
MCHC: 34.2 g/dL (ref 32.5–36.3)
MCV: 89.5 fL (ref 73.0–96.2)
MONOCYTE #: 0.4 10*3/uL (ref 0.30–1.00)
MONOCYTE %: 3 % — ABNORMAL LOW (ref 5–13)
MPV: 7.9 fL (ref 7.4–11.4)
NEUTROPHIL #: 12.5 10*3/uL — ABNORMAL HIGH (ref 1.85–7.80)
NEUTROPHIL %: 92 % — ABNORMAL HIGH (ref 43–77)
PLATELET COMMENT: NORMAL
PLATELETS: 210 10*3/uL (ref 140–440)
RBC: 4.24 10*6/uL (ref 4.06–5.63)
RDW: 14.4 % (ref 12.1–16.2)
WBC: 13.6 10*3/uL — ABNORMAL HIGH (ref 3.6–10.2)

## 2022-05-23 LAB — BASIC METABOLIC PANEL
ANION GAP: 7 mmol/L (ref 4–13)
BUN/CREA RATIO: 23 — ABNORMAL HIGH (ref 6–22)
BUN: 25 mg/dL (ref 7–25)
CALCIUM: 8.9 mg/dL (ref 8.6–10.3)
CHLORIDE: 101 mmol/L (ref 98–107)
CO2 TOTAL: 24 mmol/L (ref 21–31)
CREATININE: 1.08 mg/dL (ref 0.60–1.30)
ESTIMATED GFR: 77 mL/min/{1.73_m2} (ref >59)
GLUCOSE: 258 mg/dL — ABNORMAL HIGH (ref 74–109)
OSMOLALITY, CALCULATED: 278 mOsm/kg (ref 270–290)
POTASSIUM: 4.5 mmol/L (ref 3.5–5.1)
SODIUM: 132 mmol/L — ABNORMAL LOW (ref 136–145)

## 2022-05-23 LAB — POC BLOOD GLUCOSE (RESULTS): GLUCOSE, POC: 139 mg/dl (ref 50–500)

## 2022-05-23 NOTE — PT Treatment (Signed)
[facility]  [address]  [place], [address]  [phone]  (Fax) [phone]  Rehabilitation Department  Physical Therapy Daily Inpatient TKA Note    Date: 05/23/2022  Patient's Name: Steven Juarez  Date of Birth: September 07, 1955  Height: Height: 188 cm (6\' 2" )  Weight: Weight: 120 kg (265 lb)      Plan: Will continue under current POC.         Subjective/Objective/Assessment:  Flowsheet    05/23/22 0910   Rehab Session   Document Type therapy progress note (daily note)   General Information   Patient Profile Reviewed yes   Medical Lines PIV Line   Respiratory Status room air   Existing Precautions/Restrictions fall precautions   Pre Treatment Status   Pre Treatment Patient Status Patient sitting in bedside chair or w/c   Support Present Pre Treatment  None   Communication Pre Treatment  Charge Nurse   Communication Pre Treatment Comment cleared for PT   Pre-Treatment Pain   Pretreatment Pain Rating 2/10   Therapeutic Exercise   Comment patient particpated with group therapy, did well with exs, reviewed walking, icing, positioning to prevent excess swelling, all questions answered for home,   7-80   Post Treatment Status   Post Treatment Patient Status Patient sitting in bedside chair or w/c   Support Present Post Treatment  Family present   Communication Post Treatement Charge Nurse   Patient Effort excellent   Post-Treatment Pain   Posttreatment Pain Rating 2/10   Cognitive Assessment/Intervention   Behavior/Mood Observations behavior appropriate to situation, WNL/WFL   Physical Therapy Time and Intention   Total PT Minutes: 50     Patient did well with ex program, all questions answered for home, reviewed walking, icing, and positioning to prevent excess swelling,  dr 4/10 in cleared the patient to use ice at home ( has a skin condition,), but wants patient to monitor it closely...            Physical Therapy Inpatient TKA D/C Criteria        PATIENT/CAREGIVER  EDUCATION  Educated on exercise program? YES  Can successfully demonstrate exercise form? YES  Can demonstrate safe/effective assistance with functional mobility? YES     IMPAIRMENT BASED CRITERIA  Does the patient demonstrate knee ROM of at least 8-75 degrees on the involved leg? YES  Does the patient demonstrate a minimum of 3+/5 quadricep strength? YES  Does the patient demonstrate the ability to maintain any weight bearing precautions? YES  Does the patient demonstrated sensation of all LE dermatomes? YES  Does the patient have adequate pain control of <4/10 for functional mobility at home? YES    FUNCTIONAL BASED CRITERIA  Does that patient demonstrate the ability to ambulate 80 feet with RW using CGA/SPV assistance? YES  Does the patient demonstrate the ability to perform bed mobility with no more than min assist? YES  Does the patient demonstrated the ability to sit to stand from the bed or chair with min assist? YES  Does the patient demonstrate the ability to walk up/down 3-5 stairs with min assist as required for home entry? YES    ROM  Extension 7  Flexion 80      THERAPIST  Lindwood Qua, PTA  05/23/2022, 12:12       Intervention minutes: GROUP THERAPEUTIC EXERCISE 50 MINUTES    THERAPIST  05/25/2022, PTA  05/23/2022, 12:12

## 2022-05-23 NOTE — Nurses Notes (Signed)
Per charge nurse , Andee Poles RN, Dr. Marcelino Scot said it was okay for patient to be sent home with a cryotherapy machine. This nurse gave patient one to take home and instructed patient to put a couple of layers of clothes over knee prior to putting on cryotherapy machine. Also instructed patient on how to use the cryotherapy and to not exceed 20 minutes 6 times daily. Patient verbalized understanding.

## 2022-05-23 NOTE — Discharge Summary (Signed)
[facility]  DISCHARGE SUMMARY    PATIENT NAME:  Steven, Juarez  MRN:  [id number]  DOB:  17-Feb-1956    ENCOUNTER DATE:  05/22/2022  INPATIENT ADMISSION DATE: 05/22/2022  DISCHARGE DATE:  05/23/2022    ATTENDING PHYSICIAN: Alexia Freestone, MD  SERVICE: PRN GENERAL SURGERY  PRIMARY CARE PHYSICIAN: Aram Beecham, MD       No lay caregiver identified.    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     There are no active non-hospital problems to display for this patient.          Current Discharge Medication List        START taking these medications.        Details   HYDROcodone-acetaminophen 7.5-325 mg Tablet  Commonly known as: NORCO   1 Tablet, Oral, EVERY 6 HOURS PRN  Qty: 35 Tablet  Refills: 0            CONTINUE these medications which have CHANGED during your visit.        Details   * aspirin 81 mg Tablet, Chewable  What changed: Another medication with the same name was added. Make sure you understand how and when to take each.   81 mg, Oral, DAILY  Refills: 0     * aspirin 81 mg Tablet, Delayed Release (E.C.)  Commonly known as: ECOTRIN  What changed: You were already taking a medication with the same name, and this prescription was added. Make sure you understand how and when to take each.   81 mg, Oral, 2 TIMES DAILY, X 6 weeks  Refills: 0           * This list has 2 medication(s) that are the same as other medications prescribed for you. Read the directions carefully, and ask your doctor or other care provider to review them with  you.                CONTINUE these medications - NO CHANGES were made during your visit.        Details   Amlodipine-Benazepril 10-20 mg Capsule   1 Capsule, Oral, DAILY  Refills: 0     FLAXSEED OIL ORAL   1 Tablet, Oral, DAILY  Refills: 0     gabapentin 300 mg Capsule  Commonly known as: NEURONTIN   600 mg, Oral, 2 TIMES DAILY  Refills: 0     levothyroxine 150 mcg Tablet  Commonly known as: SYNTHROID   150 mcg, Oral, EVERY MORNING  Refills: 0     pantoprazole 40 mg Tablet, Delayed Release (E.C.)  Commonly known as: PROTONIX   40 mg, Oral, DAILY  Refills: 0     rosuvastatin 10 mg Tablet  Commonly known as: CRESTOR   10 mg, Oral, DAILY  Refills: 0     sertraline 50 mg Tablet  Commonly known as: ZOLOFT   50 mg, Oral, DAILY  Refills: 0     triamterene-hydroCHLOROthiazide 37.5-25 mg Tablet  Commonly known as: MAXZIDE-25  1 Tablet, Oral, DAILY  Refills: 0     vitamin E 400 unit Capsule   400 Units, Oral, DAILY  Refills: 0            ASK your doctor about these medications.        Details   bethanechol chloride 5 mg Tablet  Commonly known as: URECHOLINE   5 mg, DAILY  Refills: 0            Discharge med list refreshed?  YES     Allergies   Allergen Reactions    Fish Derived Anaphylaxis    Seafood [Crab] Anaphylaxis    Iodinated Contrast Media Swelling     HOSPITAL PROCEDURE(S):   No orders of the defined types were placed in this encounter.    Surgical/Procedural Cases on this Admission       Case IDs Date Procedure Surgeon Location Status    [id number] 05/22/22 MAKO ROBOTIC ASSISTED LEFT TOTAL KNEE ARTHROPLASTY USING PRESSFIT TRIATHLON IMPLANTS Alexia Freestone, MD PRN OR MAIN Comp          REASON FOR HOSPITALIZATION AND HOSPITAL COURSE   BRIEF HPI:  This is a 66 y.o., male admitted for Elective TKR  BRIEF HOSPITAL NARRATIVE:     PT did well post op.  ASA BID for DVT prophy.  Has epidermolysis, no bullae noted.  Precautions discussed with ice therapy    TRANSITION/POST DISCHARGE CARE/PENDING TESTS/REFERRALS: OPPT.  FU  PCP.    CONDITION ON DISCHARGE:  A. Ambulation: Ambulation with assistive device  B. Self-care Ability: With partial assistance  C. Cognitive Status Oriented x 3  D. Code status at discharge:       LINES/DRAINS/WOUNDS AT DISCHARGE:   Patient Lines/Drains/Airways Status       Active Line / Dialysis Catheter / Dialysis Graft / Drain / Airway / Wound       Name Placement date Placement time Site Days    Peripheral IV Right Basilic  (medial side of arm) 05/22/22  0733  -- 1    Wound  Incision Left Knee 05/22/22  --  -- 1                    DISCHARGE DISPOSITION:  Home discharge and Outpatient PT   DISCHARGE INSTRUCTIONS:  Post-Discharge Follow Up Appointments       Friday May 24, 2022    Evaluation with Oliver Hum at  8:45 AM      Friday May 31, 2022    Follow Up Appointment with Tarri Abernethy, PTA at 11:15 AM      Monday Jun 03, 2022    Follow Up Appointment with Tarri Abernethy, PTA at 11:15 AM      Tuesday Jun 04, 2022    Follow up with Alexia Freestone, MD    Phone: [phone]    Where: [address], [place] [address]      Wednesday Jun 05, 2022    Follow Up Appointment with Tarri Abernethy, PTA at 11:15 AM      Friday Jun 07, 2022    Follow Up Appointment with Tarri Abernethy, PTA at 11:15 AM      Monday Jun 10, 2022    Follow Up Appointment with Tarri Abernethy, PTA at 11:15 AM    Follow up with Aram Beecham, MD    Phone: [phone]    Where: [address] [place] [facility] [place] [address]      Wednesday Jun 12, 2022  Follow Up Appointment with Tarri Abernethy, PTA at 11:15 AM      Friday Jun 14, 2022    Follow Up Appointment with Tarri Abernethy, PTA at 11:15 AM      Physical Therapy, [facility], [place]   [address]  [place] [address]  [phone]             PT EVALUATE AND TREAT- OUTPATIENT     Need to be addressed: EVALUATE AND TREAT    Reason: Post op TKR      DME - WALKER Front Wheeled    Please note - If patient is 300 lbs or greater please  order bariatric or heavy duty items.     Patient has mobility limits that significantly impairs ability to participate in one or more mobility related ADL's (MRADL's): Yes    Moblity Limitations: Pt at heightened risk of injury r/t attempts to fulfill MRADL's & can safely use walker which resolves issue    Walker Type: Front Wheeled    Freedom of Choice: I have informed patient of their freedom of choice with respect to DME providers      DME - BEDSIDE COMMODE    A standard commode is covered when the patient is physically incapable of utilizing regular toilet facilities.  An extra wide/heavy duty commode chair is covered for a patient who weighs 300 pounds or more.     I certify that a bedside commode is necessary due to Patient is confined to a single room    Bedside Commode Type Standard Bedside Commode    Freedom of Choice: I have informed patient of their freedom of choice with respect to DME providers           Alexia Freestone, MD    Copies sent to Care Team         Relationship Specialty Notifications Start End    Aram Beecham, MD PCP - General INTERNAL MEDICINE  05/03/22     Phone: [phone] Fax: [phone]         [address] [place] [address]            Referring providers can utilize [email or web address] to access their referred [facility] Medicine patient's information.

## 2022-05-23 NOTE — OT Evaluation (Signed)
[facility]  [place], [address]  [phone]  [phone]  Rehabilitation Services  Occupational Therapy Inpatient Initial Evaluation      Patient Name: Steven Juarez  Date of Birth: April 12, 1956  Height: Height: 188 cm (_0 )  Weight: Weight: 120 kg (265 lb)  Room/Bed: 382/A  Payor: Theme park manager / Plan: Theme park manager [facility] / Product Type: Non Managed Care /         PMH:   Past Medical History:   Diagnosis Date    Esophageal reflux     Hypercholesterolemia     Hypertension     Sleep apnea            Assessment:      Functional Level at Time of Session: (P) Patient is a pleasant 66 year old male who was admitted for an elective L TKA. Patient lives with spouse in a 2L house. Prior to admission, she was independent in all ADLs, IADLs and functional mobility. During OT evaluation, patient was alert, oriented x4, cooperative and able to follow multistep commands. He has full UB AROM and good (4/5) UB strength. OT educated patient on use of ADL kit and knee precautions. Patient was able to return demo successfully. OT evaluation complete. Patient to continue restorative care services during IP/acute stay.    Discharge Needs:   Equipment Recommendation:  reacher, sock aid, long shoe horn, long handled sponge    The patient presents with mobility limitations due to impaired range of motion that significantly impair/prevent patient's ability to participate in mobility-related activities of daily living (MRADLs) including  ambulation and transfers in order to safely complete, toileting, bathing, safely entering/exiting the home, in reasonable time. This functional mobility deficit can be sufficiently resolved with the use of a Anticipated Equipment Needs at Discharge: (P) bathing equipment, bedside commode, dressing equipment, reacher, front wheeled walker  in order to decrease the risk of falls, morbidity, and mortality in performance of these MRADLs.   Patient is able to safely use this assistive device.    Discharge Disposition:  home with assist    JUSTIFICATION OF DISCHARGE RECOMMENDATION   Based on current diagnosis, functional performance prior to admission, and current functional performance, this patient requires continued OT services in Anticipated Discharge Disposition: (P) home with assist  in order to achieve significant functional improvements.    Plan:   Current Intervention:       To provide Occupational therapy services  Therapy Frequency: (P) Evaluation Only for duration of    .       The risks/benefits of therapy have been discussed with the patient/caregiver and he/she is in agreement with the established plan of care.       Subjective & Objective     MEDICAL HISTORY:   Past Medical History:   Diagnosis Date    Esophageal reflux     Hypercholesterolemia     Hypertension     Sleep apnea          SURGICAL HISTORY:   Past Surgical History:   Procedure Laterality Date    HX BACK SURGERY      HX CHOLECYSTECTOMY      HX KNEE ARTHROSCOPY Right        ipp    INSERT FLOW SHEET     05/23/22 1118   Rehab Session   Document Type evaluation   Total OT Minutes: 23   Patient Effort excellent   General Information  Patient Profile Reviewed yes   Medical Lines PIV Line   Respiratory Status room air   Pre Treatment Status   Pre Treatment Patient Status Patient sitting in bedside chair or w/c;Call light within reach;Telephone within reach;Patient safety alarm activated;Nurse approved session   Support Present Pre Treatment  Family present   Communication Pre Treatment  Charge Nurse   Communication Pre Treatment Comment Cleared for OT   Living Environment   Living Environment Comment Lives with spouse in a 2L house (3 steps outdoor + 5 steps indoor). He has a walk-in shower and BSC. He was independent in functional mobility prior to admission   Functional Level Prior   Ambulation 0 - independent   Transferring 0 - independent   Toileting 0 - independent   Bathing  0 - independent   Dressing 0 - independent   Eating 0 - independent   Communication 0 - understands/communicates without difficulty   Swallowing 0-->swallows foods/liquids without difficulty   Self-Care   Dominant Hand right   Usual Activity Tolerance excellent   Current Activity Tolerance excellent   Important Activities family;hobbies;social   Vital Signs   Vitals Comment Pulse ox disconnected   Coping/Psychosocial   Observed Emotional State calm;cooperative   Verbalized Emotional State acceptance   Family/Support System   Family/Support Persons spouse   Involvement in Care at bedside   Coping/Psychosocial Response Interventions   Plan Of Care Reviewed With patient;spouse   Cognitive Assessment/Interventions   Behavior/Mood Observations behavior appropriate to situation, WNL/WFL   Orientation Status oriented x 4   Attention WNL/WFL   Follows Commands WFL   Vision Assessment/Interventions   Visual Impairment/Limitations WFL with corrective lenses   RUE Assessment   RUE Assessment WFL for age   LUE Assessment   LUE Assessment WFL for Age   Grip Strength   Grip Left (4/5) good, left   Right Grip (4/5) good, right   ADL Assessment/Intervention   ADL Comments Eating/Feeding, Grooming: Independent; UB/LB Dress/Bathe, Toileting, Functional transfers: Modified Independence   Post Treatment Status   Post Treatment Patient Status Patient sitting in bedside chair or w/c;Call light within reach;Telephone within reach;Patient safety alarm activated   Support Present Post Treatment  Family present   Clinical Impression   Functional Level at Time of Session Patient is a pleasant 66 year old male who was admitted for an elective L TKA. Patient lives with spouse in a 2L house. Prior to admission, she was independent in all ADLs, IADLs and functional mobility. During OT evaluation, patient was alert, oriented x4, cooperative and able to follow multistep commands. He has full UB AROM and good (4/5) UB strength. OT educated patient  on use of ADL kit and knee precautions. Patient was able to return demo successfully. OT evaluation complete. Patient to continue restorative care services during IP/acute stay.   Therapy Frequency Evaluation Only   Anticipated Equipment Needs at Discharge bathing equipment;bedside commode;dressing equipment;reacher;front wheeled walker   Anticipated Discharge Disposition home with assist   Evaluation Complexity Justification   Occupational Profile Review Brief history   Performance Deficits 1-3 deficits   Clinical Decision Making Low analytic complexity   Evaluation Complexity Low       EVALUATION COMPLEXITY: CLINICAL DECISION MAKING OF LOW COMPLEXITY AS INDICATED BY PMH, OCCUPATIONAL THERAPY ASSESSMENT OF MUSCULOSKELETAL AND NEUROLOGICAL SYSTEMS AND ACTIVITY LIMITATIONS. CLINICAL PRESENTATION IS STABLE AND UNCOMPLICATED.      EVALUATION 10 minutes and ADL/IADL TRAINING 13 minutes    Therapist:  Amado Nash, OT,05/23/2022 13:42

## 2022-05-23 NOTE — PT Evaluation (Signed)
[facility]  [address]  [place], [id number]  [phone](Office) [phone]  (Fax) [phone]  Rehabilitation Services  Physical Therapy Inpatient TKA Initial Evaluation    Patient Name: Steven Juarez  Date of Birth: 12-04-1955  Height: Height: 188 cm (6\' 2" )  Weight: Weight: 120 kg (265 lb)  Room/Bed: 382/A  Payor: [employer]vertising copy[employer] / Plan: [employer]vertising copy[employer]ALTH[employer] [facility] / Product Type: Non Managed Care /       PMH:  Past Medical History:   Diagnosis Date    Esophageal reflux     Hypercholesterolemia     Hypertension     Sleep apnea            Assessment:      Patient was admitted for an elective left TKA which has tolerated well. He participated well in PT evaluation. He will benefit from continued PT services for further education and training following TKA protocol. Will progress as tolerated. He plans to return to home with his wife and attend outpatient PT services after discharge.    Discharge Needs:    Equipment Recommendation: (P) front wheeled walker      The patient presents with mobility limitations due to impaired range of motion, impaired strength, weight bearing restrictions, impaired functional activity tolerance, and pain  that significantly impair/prevent patient's ability to participate in mobility-related activities of daily living (MRADLs) including  ambulation and transfers in order to safely complete, toileting, bathing, working, safely entering/exiting the home. This functional mobility deficit can be improved with the use of a (P) front wheeled walker  in order to decrease the risk of falls in performance of these MRADLs.  Patient is able to safely use this assistive device.    Discharge Disposition: (P) home with assist, home with outpatient services    JUSTIFICATION OF DISCHARGE RECOMMENDATION   Based on current diagnosis, functional performance prior to admission, and current functional performance, this patient requires continued PT services in (P) home with  assist, home with outpatient services in order to achieve significant functional improvements in these deficit areas: gait, locomotion, and balance, muscle performance, ROM (range of motion).        Plan:   Current Intervention: (P) gait training, patient/family education, ROM (range of motion), stair training, strengthening, stretching, transfer training  To provide physical therapy services (P) other (see comments) (1-3x/day Monday through Saturday)  for duration of (P) until discharge.    The risks/benefits of therapy have been discussed with the patient/caregiver and he/she is in agreement with the established plan of care.       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 He was up from surgery too late on DOS to begin PT services, therefore, he was seen first thing  this AM to allow his participation in the therapy group session. He tolerated well.   Medical Lines PIV Line   Respiratory Status room air   Existing Precautions/Restrictions weight bearing restriction;fall precautions;full code  (WBAT LLE)   Mutuality/Individual Preferences   Anxieties, Fears or Concerns Concerned with pain during ROM   Individualized Care Needs Physical Therapy   Living Environment   Lives With spouse   Living Arrangements house  (Split foyer home but only needs to access the upper level)   Home Accessibility stairs to enter home;stairs within home   Stairs Within Home, Primary   Stair  Railings, Within Home, Primary railing on right side (ascending)   Number of Stairs, Within Home, Primary five   Stairs, Within Home, Primary from entryway up to the main living area which is where he will be during recovery. He does not need to go to the lower level.   Home Main Entrance   Stair Railings, Main Entrance railings on both sides of stairs   Number of Stairs, Main Entrance two   Functional Level Prior   Ambulation 0 - independent   Transferring 0 - independent   Toileting 0 - independent   Bathing 0 - independent   Dressing 0 - independent   Eating 0 - independent   Communication 0 - understands/communicates without difficulty   Prior Functional Level Comment Patient did not use an AD prior to surgery. He was independent in all selfcare, ADL's and ambulation. He drives. Works fulltime in a job that requires alot of standing.   Pre Treatment Status   Pre Treatment Patient Status Patient sitting in bedside chair or w/c   Support Present Pre Treatment  None   Communication Pre Treatment  Charge Nurse   Communication Pre Treatment Comment cleared for PT evaluation   Cognitive Assessment/Interventions   Behavior/Mood Observations behavior appropriate to situation, WNL/WFL;alert;cooperative   Orientation Status oriented x 4   Attention WNL/WFL   Follows Commands WNL   Pre- Treatment Vital Signs   Pre-Treatment Heart Rate (beats/min) 91   Pre SpO2 (%) 96   O2 Delivery Pre Treatment room air   Pre-Treatment Pain   Pretreatment Pain Rating 2/10   Pre/Posttreatment Pain Comment 2/10 at rest, increases to 8/10 with initial movement of knee but that does improve some as the knee stretched out to a 6/10. Returned to 2/10 when back at rest.   RUE Assessment   RUE Assessment WFL- Within Functional Limits   LUE Assessment   LUE Assessment WFL- Within Functional Limits   RLE Assessment   RLE Assessment WFL- Within Functional Limits   LLE Assessment   LLE Assessment X-Exceptions   LLE ROM knee (-)5 to 65 degrees, hip and  ankle WNL   LLE Strength knee ext 3/5, flex 3+/5, hip 4/5 and ankle 5/5   Trunk Assessment   Trunk Assessment WFL-Within Functional Limits   Mobility Assessment/Training   Additional Documentation Transfer Assessment/Treatment (Group);Gait Assessment/Treatment (Group);Stairs Assessment/Treatment (Group)   Comment Patient states that he was able to sit up to the EOB on his own without assistance but that aide was standing by,   Transfer Assessment/Treatment   Transfer Comment Patient had mild unsteadiness initially upon standing but once he had his feet adjusted was steady. He required cues for hand placement to push and reach back during transfers. He did not follow well for stand to sit and sat down hard. Therapist reinstructed with demonstration and he voiced his understanding.   Sit-Stand-Sit, Assist  Device walker, front wheeled   Sit-Stand Independence contact guard assist   Stand-Sit Independence contact guard assist   Gait Assessment/Treatment   Total Distance Ambulated 340   Assistive Device  walker, front wheeled   Comment cues to extend knee during stance phase, bend knee during swing phase and to heel strike left heel. He responded well to cues and was walking with a good reciprocal patttern.   Distance in Feet 340 feet   Independence  contact guard assist;stand-by assistance   Stairs Assessment/Treatment   Number of Stairs 4   Handrail Location both sides   Independence Level stand-by assistance   Technique Used step to step (ascending);step to step (descending)   Post Treatment Status   Post Treatment Patient Status Other (See comments)  (in recliner chair in group therapy room awaiting group exercise/education session)   Support Present Post Treatment  Other (See comments)  (Vicky, PTA, therapy group leader)   Film/video editor Nurse   Communication Post Treatment Comment updated on his status and passed along that patient is having numbness in his left hand.   Patient Effort excellent    Post-Treatment Vital Signs   Post-treatment Heart Rate (beats/min) 96   Post SpO2 (%) 95   O2 Delivery Post Treatment room air   Post-Treatment Pain   Posttreatment Pain Rating 2/10   Physical Therapy Clinical Impression   Assessment Patient was admitted for an elective left TKA which has tolerated well. He participated well in PT evaluation. He will benefit from continued PT services for further education and training following TKA protocol. Will progress as tolerated. He plans to return to home with his wife and attend outpatient PT services after discharge.   Criteria for Skilled Therapeutic meets criteria   Impairments Found (describe specific impairments) gait, locomotion, and balance;muscle performance;ROM (range of motion)   Functional Limitations in Following  self-care;home management;work;community/leisure   Rehab Potential good   Therapy Frequency other (see comments)  (1-3x/day Monday through Saturday)   Predicted Duration of Therapy Intervention (days/wks) until discharge   Anticipated Equipment Needs at Discharge (PT) front wheeled walker   Anticipated Discharge Disposition home with assist;home with outpatient services   Evaluation Complexity Justification   Patient History: Co-morbidity/factors that impact Plan of Care 1-2 that impact Plan of Care   Examination Components 1-2 Exam elements addressed   Presentation Stable: Uncomplicated, straight-forward, problem focused   Clinical Decision Making Low complexity   Evaluation Complexity Low complexity   Planned Therapy Interventions, PT Eval   Planned Therapy Interventions (PT) gait training;patient/family education;ROM (range of motion);stair training;strengthening;stretching;transfer training   Physical Therapy Time and Intention   Total PT Minutes: 26       TOTAL KNEE ARTHROPLASTY (TKA) PHYSICAL THERAPY GOALS    1) AMBULATE 300 FEET WITH WALKER AND SBA/S.   2) TRANSFER BED TO CHAIR WITH SBA/S.   3) INCREASED STRENGTH TO 3+/5 KNEE EXTENSION.   4)  INCREASED KNEE AAROM TO 4 TOP 90 DEGREES.  5) NEGOTIATES TRAINING STEPS USING HANDRAIL AND SBA/S.       Physical Therapy Inpatient TKA D/C Criteria        PATIENT/CAREGIVER EDUCATION  Educated on exercise program? NO  Can successfully demonstrate exercise form? NO  Can demonstrate safe/effective assistance with functional mobility? YES     IMPAIRMENT BASED CRITERIA  Does the patient demonstrate knee ROM of at least 8-75 degrees on the involved leg? NO  Does the patient demonstrate a minimum of 3+/5 quadricep strength? NO  Does the patient demonstrate the ability to maintain any weight bearing precautions? YES  Does the patient demonstrated sensation of all LE dermatomes? YES  Does the patient have adequate pain control of <4/10 for functional mobility at home? NO    FUNCTIONAL BASED CRITERIA  Does that patient demonstrate the ability to ambulate 80 feet with RW using CGA/SPV assistance? YES  Does the patient demonstrate the ability to perform bed mobility with no more than min assist? YES  Does the patient demonstrated the ability to sit to stand from the bed or chair with min assist? YES  Does the patient demonstrate the ability to walk up/down 3-5 stairs with min assist as required for home entry? YES    ROM  Extension (-)5 degrees  Flexion 65 degrees        INTERVENTION MINUTES: EVALUATION 11 minutes and GAIT TRAINING 15 MINUTES    EVALUATION COMPLEXITY : CLINICAL DECISION MAKING OF LOW COMPLEXITY AS INDICATED BY PMH, PHYSICAL THERAPY ASSESSMENT OF MUSCULOSKELETAL AND NEUROLOGICAL SYSTEMS AND ACTIVITY LIMITATIONS. CLINICAL PRESENTATION IS STABLE AND UNCOMPLICATED    Therapist:     Rollene Rotunda, PT  05/23/2022, 10:01

## 2022-05-23 NOTE — Nurses Notes (Signed)
Saline lock removed, catheter noted to be intact, and pressure held to site. Patient verbalizes understanding the importance of following all discharge instructions, performing exercises and following joint precautions learned with physical therapy, precautions for preventing falls in the home, and returning for all follow up appointments. Patient in possession of all personal belongings, including discharge packet, nozin for nasal antimicrobial, and paper script x 1. Escorted to family vehicle via wheelchair by staff.

## 2022-05-24 ENCOUNTER — Ambulatory Visit (HOSPITAL_COMMUNITY)
Admission: RE | Admit: 2022-05-24 | Discharge: 2022-05-24 | Disposition: A | Discharge status: Still In Hospital | Payer: 59 | Source: Ambulatory Visit

## 2022-05-24 ENCOUNTER — Other Ambulatory Visit: Payer: Self-pay

## 2022-05-24 DIAGNOSIS — M25562 Pain in left knee: Secondary | ICD-10-CM | POA: Insufficient documentation

## 2022-05-24 NOTE — PT Evaluation (Signed)
[facility]  Outpatient Physical Therapy  [address]  [place], [address]  ([phone]  (Fax) [phone]      Physical Therapy Lower Extremity Evaluation    Date: 05/24/2022  Patient's Name: Steven Juarez  Date of Birth: 07-May-1956    PT diagnosis/Reason for Referral: L TKA surgery date 05/22/2022           SUBJECTIVE  Date of onset: 05/22/2022    Mechanism of injury: TKA surgery    PMH:  Includes ruptured discs L5 (back fusion approx 30 years ago - no metal), bulging disc C3-T1, R meniscal knee injury (surgery 2 years ago - doing well)     Previous episodes/treatments: years ago to knee arthritis    Medications for this problem:  pain medicine for knee    Diagnostic tests: N/A    Patient goals: REDUCE PAIN, RETURN TO WORK, and NORMALIZE FUNCTION    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    Next MD visit: approx 2 weeks from surgery    Pain location: left thigh and left knee surgerical site                    Pain description: SHARP, DULL, ACHING, and PRESSURE    Pain frequency:  INTERMITTENT    Pain rating: Now 5/10   Best 0/10   Worst 10/10    Radiculopathy: no    Pain increases with: STAND, SIT, WALK, STAIR CLIMBING, and BENDING           decreases with : COLD, MEDICATION, and POSITION CHANGE    Weakness: yes    Sleep affected: no    Subjective Functional Reports:  Sitting: LIMITED  Standing: LIMITED  Walking: LIMITED  Lifting: LIMITED    Patient-Specific Functional Score:  Problem Score   1. Go back to work  0   2. Walk without a device 0   3. Driving  0   Total 0           OBJECTIVE    AROM   right left   Knee Flexion 92 (recumbent position)  -15 (recumbent sitting position)    Knee Extension 0-5-92  (Recumbent position)  67 (recumbent sitting position)    Ankle DF WNL WNL   Ankle PF WNL WNL   Ankle Inversion WNL WNL   Ankle Eversion WNL WNL     Strength   right left   Hip flexion  5/5 3-/5   Hip extension  5/5 NT   Knee flexion  5/5 3-/5   Knee extension   5/5 2+/5     Girth  Mid knee R= 52 cm L= 56 cm  5 cm above R= 50 cm L = 59.5 cm  5 cm below R=  44.5 cm L= 51 cm     Gait: USES ASSISTIVE DEVICE, RECIPROCATING STEPS WITH ASYMMETRIC STRIDE LENGTH, NON-RECIPROCATING STEPS, LEFT FOOT DOES NOT PASS RIGHT FOOT, INITIATION OF GAIT WITH HESITATION, USE OF BOTH HANDRAILS, and UNABLE TO DO STEPS    HEP:  Access Code: [email or web address]  URL: [email or web address]  Date: 05/24/2022  Prepared by: Oliver Hum    Exercises  - Ankle Pumps in Elevation  - 1 x daily - 7 x weekly - 3 sets - 10 reps  - Supine Heel Slide  - 1 x daily - 7 x weekly - 3 sets - 10 reps  - Supine Hip Abduction  - 1 x daily - 7 x weekly -  3 sets - 10 reps    Treatment provided:REVIEW OF POC AND GOALS WITH PATIENT, ALL QUESTIONS ANSWERED, PATIENT EDUCATION, and THERAPEUTIC EXERCISE   had pt elevate the knee with ice packs for at least 10-15 min during the evaluation for pain management          ASSESSMENT    Impression: [name] is a 66 y/o male with a TKA - surgery date 05/22/2022.  He presented to the evaluation walking on a forward rolling walker, decreased weight bearing on the L, decreased active and passive left knee and hip ROM, decreased strength on the L, decreased transfers/mat mobility, swelling, and pain in the L knee.  Pt would like to be able to walk without an assistive device, have no pain, improve knee to full strength and ROM, and return to work.  Pt would benefit from skilled outpatient physical therapy services.     Rehab potential: GOOD    Short Term Goals: 4 Weeks   - Pt will improve his L knee PROM to 0-90 degrees.     - Pt will improve his L leg strength to 3/5 in all muscle groups.   - Pt will walk with a cane and be able to ascend/descend steps 1 step at a time.   - Pt will improve his PSFS to 5/10.    Long Term Goals: 8 Weeks  - Pt will improve his L knee AROM to 0-110 degrees.   - Pt will have no quad lag with SLR x 10.    - Pt will improve his L leg strength to 4/5 in all muscle  groups.   - Pt will walk without an assistive device and ascend/descend a flight of regular steps reciprocally in the clinic without use of the rails.    - Pt will improve his PSFS to 7/10.    PLAN  Patient will attend 2-3 times per week x 8 weeks. Therapy may include, but is not limited to THERAPEUTIC EXERCISES, MYOFASCIAL/JOINT MOBILIZATION, TRANSFER/GAIT TRAINING, HOME INSTRUCTIONS, HEAT/COLD, ELECTRICAL STIMULATION, KINESIOTAPE, and NEURO RE-EDUCATIOIN    Plan for next visit   - Nustep  - PROM for knee flexion/extension  - mat exercises (increase HEP)  - pain management  - swelling management     Evaluation complexity:   Personal factors impacting POC:  None   Co-morbidities impacting POC:  None  Complexity of physical exam: INCLUDING MUSCULOSKELETAL SYSTEM (POSTURE, ROM, STRENGTH, HEIGHT/WEIGHT) and INCLUDING NEUROMUSCULAR EXAM (BALANCE, GAIT, LOCOMOTION, MOBILITY)   Clinical Presentation: STABLE   Evaluation Complexity: LOW-HISTORY 0, EXAMINATION 1-2, STABLE PRESENTATION      Total Session Time 45, Timed code minutes 15, and Untimed code minutes 30  Intervention minutes: EVALUATION 30 minutes and THERAPEUTIC EXERCISE 15 minutes    Oliver Hum  05/24/2022, 09:52          Start of Service: _________          Certification:    From:______  Through:_________    I certify the need for these services furnished under this plan of treatment and while under my care.    Referring Provider Signature: _______________     Date : _____________________    Printed name of Referring Provider________________________________________

## 2022-05-28 ENCOUNTER — Other Ambulatory Visit: Payer: Self-pay

## 2022-05-28 ENCOUNTER — Ambulatory Visit (HOSPITAL_COMMUNITY)
Admission: RE | Admit: 2022-05-28 | Discharge: 2022-05-28 | Disposition: A | Discharge status: Still In Hospital | Payer: 59 | Source: Ambulatory Visit

## 2022-05-28 NOTE — Nurses Notes (Signed)
RN completed follow-up phone call with patient. He states that he is doing "okay" but is still having a lot of extra swelling. He denied any redness, warmth, or tenderness to touch. RN educated him to watch for those symptoms.He verbalized understanding

## 2022-05-28 NOTE — PT Treatment (Signed)
[facility]  Outpatient Physical Therapy  [address]  [place], [address]  [phone]  (Fax) [phone]    Physical Therapy Treatment Note    Date: 05/28/2022  Patient's Name: Steven Juarez  Date of Birth: 11-Oct-1955    Visit #/POC:1/24  Authorization:N/A  POC Signed?: No  POC Ends: 07/19/2021  Next Progress Note Due: 06/23/2022    Evaluating Physical Therapist: Oliver Hum, MSPT  PT diagnosis/Reason for Referral: L TKE surgery date 05/22/2022  Next Scheduled Physician Appointment: approx 06/03/22  Allergies/Contraindications: fish (high), seafood (high), and iodinated contrast media    Subjective: Pt is feeling better.  Leg is really swollen.  Skin from ace warp under knee is irritated.      Objective: interventions today are noted in the chart below      EXERCISE/ACTIVITY NAME REPETITIONS RESISTANCE COMPLETED THIS DOS   Reviewed HEP   X 10 (10 min)   yes   NuStep 5 min L2 yes   KTC with therapy bal    3 x 10  yes   Quad sets   2 x 10  Yes; HEP   Glut sets   2 x 10  Yes; HEP   SAQ   X 10  AAROM (unable to do independently)  Yes; HEP   Ice and elevation   10-15 min while doing exercises (quad sets/glut sets, ankle pumps)  Yes                             Access Code: [email or web address]  URL: [email or web address]  Date: 05/28/2022  Prepared by: Oliver Hum  Exercises Added   - Supine Quad Set  - 1 x daily - 7 x weekly - 3 sets - 10 reps  - Supine Gluteal Sets  - 1 x daily - 7 x weekly - 3 sets - 10 reps  - Supine Short Arc Quad  - 1 x daily - 7 x weekly - 3 sets - 10 reps    HEP:  Exercise Code Date given Continue   Ankle pumps [email or web address] 05/24/2022 yes   Supine heel slides      Supine hip abd      Quad sets Added to [email or web address] 05/28/2022 yes   Glut sets      LAQ           Assessment: Pt's knee is very swollen.  He was having some skin rubbing from his ace wrap along the posterior section of his leg.  Doffed bandage and reapplied 2 ace wraps because 1 ace wrap is not  supportive enough causing wrap to slide below swelling of knee and to cut off circulation.  Started on NuStep, moved to mat to practice HEP from initial eval, added new mat exercises and practiced new exercises while pt elevated leg with ice under and on top of knee for swelling management.  Pt tolerated treatment well.      Plan: Continue with LE ROM, strengthening, and swelling/pain management.    Total Session Time 30 and Timed code minutes 30  THERAPEUTIC EXERCISE 30 minutes    Oliver Hum  05/28/2022, 11;21

## 2022-05-29 ENCOUNTER — Ambulatory Visit (HOSPITAL_COMMUNITY): Payer: Self-pay

## 2022-05-31 ENCOUNTER — Ambulatory Visit (HOSPITAL_COMMUNITY)
Admission: RE | Admit: 2022-05-31 | Discharge: 2022-05-31 | Disposition: A | Discharge status: Still In Hospital | Payer: 59 | Source: Ambulatory Visit

## 2022-05-31 ENCOUNTER — Other Ambulatory Visit: Payer: Self-pay

## 2022-05-31 NOTE — PT Treatment (Addendum)
[facility] Medicine [facility]  Outpatient Physical Therapy  [address]  [place], [address]  [phone]  (Fax) [phone]    Physical Therapy Treatment Note    Date: 05/31/2022  Patient's Name: Steven Juarez  Date of Birth: 06/02/1956    Visit #/POC: 2/24  Authorization:N/A  POC Signed?: No  POC Ends: 07/19/2021  Next Progress Note Due: 06/23/2022    Evaluating Physical Therapist: Oliver Hum, MSPT  PT diagnosis/Reason for Referral: L TKE surgery date 05/22/2022  Next Scheduled Physician Appointment: approx 06/03/22  Allergies/Contraindications: fish (high), seafood (high), and iodinated contrast media    Subjective: Patient arrives amb with SC that is too short, in the wrong hand and with antalgic gait. He reports he is experiencing swelling into proximal thigh of LLE.     Objective: treatment delivered as noted below.    Measurements: L knee supine flexion 85 degrees.    EXERCISE/ACTIVITY NAME REPETITIONS RESISTANCE COMPLETED THIS DOS   Reviewed HEP   X 10 (10 min)   yes   NuStep 10 min L2 yes   KTC with therapy bal    x 10  yes   Quad sets   2 x 10  n; HEP   Glut sets   2 x 10  n; HEP   SAQ   3x5 Active Yes; HEP   Ice and elevation   10-15 min while doing exercises (quad sets/glut sets, ankle pumps)  No: deferred to home   Seated heel slide X10   y                       HEP:  Exercise Code Date given Continue   Ankle pumps [email or web address] 05/24/2022 yes   Supine heel slides      Supine hip abd      Quad sets Added to [email or web address] 05/28/2022 yes   Glut sets      LAQ           Assessment: Patient has knee wrapped with ace bandage and then covered with 4x4 style band-aids along the incision. He reports he was told upon D/C he could shower with adhesive bandage don after day 4, to remove at day 7 and then cover with 4x4 bandages. He was also instructed to wrap the knee with ace wraps to aid in decreasing swelling. Patient c/o the ace wrap is irritating his skin. Treating PTA removed ace wrap and  bandages, incision looks good (minimal dried blood at distal incision). Patient was instructed to leave ace wrap and bandages off and when he showers to allow soap and water to just run over the incision ensuring he does not touch the area with hands to protect from infections. Unless aftercare protocol for TKA has changed the correct education was given to patient. He was witnessed to be touching incision and was given VC on at least 3 separate occasions. Correction was made to seated heel slide to facilitate correct technique allowing for more effective end range flexion stretching. SAQ was active with quick fatigue. Patient was instructed to continue with exercises previously instructed in pre-op class and during group therapy during his hospitalization for TKA. A pocket of what seems to be fluid visible lateral/distal aspect of knee, patient reports it has decreased in size. Patient was also instructed to return to RW secondary to reports of L knee buckling and poor gait mechanics with SC.     Plan:  Assess status of swelling, his ROM  and strength ability with exercises. Will progress strengthening as patient can tolerate.     Total Session Time 35 and Timed code minutes 35  THERAPEUTIC EXERCISE 35 minutes    Benjie Karvonen, PTA  05/31/2022 14:17

## 2022-06-03 ENCOUNTER — Other Ambulatory Visit: Payer: Self-pay

## 2022-06-03 ENCOUNTER — Ambulatory Visit (HOSPITAL_COMMUNITY)
Admission: RE | Admit: 2022-06-03 | Discharge: 2022-06-03 | Disposition: A | Discharge status: Still In Hospital | Payer: 59 | Source: Ambulatory Visit

## 2022-06-03 NOTE — PT Treatment (Addendum)
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 Continues to have swelling, but is improved. He states his soreness seems to have moved up into L thigh, he also reports upon waking this morning the knee "just feels better."  Objective: treatment delivered as noted below.    Measurements: L knee AROM supine: 0-97 degrees.     EXERCISE/ACTIVITY NAME REPETITIONS RESISTANCE COMPLETED THIS DOS   Reviewed HEP   X 10 (10 min)   no   NuStep 8 min L3 yes   KTC with therapy bal    x 10  yes   Quad sets   2 x 10  n; HEP   Glut sets   2 x 10  n; HEP   SAQ    Active Yes; HEP   Ice and elevation   10-15 min while doing exercises (quad sets/glut sets, ankle pumps)  No: deferred to home   Seated heel slide X10   y   Stair stretch flexion and extension X10 each with 5 sec hold  y   Step ups: fwd and lateral    Step downs X10 ea    x10 6 inch step    4 inch step Y    y   Leg press: DL D92 42# y   CP post treatment X10 min  y       HEP:  Exercise Code Date given Continue   Ankle pumps [id number] 05/24/2022 yes   Supine heel slides      Supine hip abd      Quad sets Added to [id number] 05/28/2022 yes   Glut sets      LAQ           Assessment: Patient was advised to contact his local pharmacist concerning taking Motrin  secondary to currently taking 2 Asprin to reduce risk of blood clots post TKA surgery. He was surprisingly able to perform step ups to 6 inch step without compensatory mechanics. Amazing gains with L Quad strength exhibited by ability to perform SAQ x50 vs last visit (05/31/22)  3x5, he was also able to perform step ups to 6 inch step.     Short Term Goals: 4 Weeks               - Pt will improve his L knee PROM to 0-90 degrees.                            - Pt will improve his L leg strength to 3/5 in all muscle groups.               - Pt will walk with a cane and be able to ascend/descend steps 1 step at a time.               -  Pt will improve his PSFS to 5/10.     Long Term Goals: 8 Weeks  - Pt will improve his L knee AROM to 0-110 degrees.   - Pt will have no quad lag with SLR x 10.                     - Pt will improve his L leg strength to 4/5 in all muscle groups.               - Pt will walk without an assistive device and ascend/descend a flight of regular steps reciprocally in the clinic without use of the rails.                - Pt will improve his PSFS to 7/10.    Plan:  Assess outcome of RTD.     Total Session Time 50 and Timed code minutes 40 , untimed min 10.   THERAPEUTIC EXERCISE 40 minutes    Steven Juarez, [place]  06/03/2022 11:31

## 2022-06-05 ENCOUNTER — Ambulatory Visit (HOSPITAL_COMMUNITY): Payer: Self-pay

## 2022-06-07 ENCOUNTER — Other Ambulatory Visit: Payer: Self-pay

## 2022-06-07 ENCOUNTER — Ambulatory Visit (HOSPITAL_COMMUNITY)
Admission: RE | Admit: 2022-06-07 | Discharge: 2022-06-07 | Disposition: A | Discharge status: Still In Hospital | Payer: 59 | Source: Ambulatory Visit

## 2022-06-07 NOTE — PT Treatment (Signed)
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  Pt notes he has good days and bad days.  Notes still swelling and soreness that is better sometimes and not others.  States he is doing exercise at home as advised.  Today knee is very sore and feels stiff.  Reports this happens anytime he is in one position for long time.     Objective: Warm up on Nustep followed by activities as noted below.      Measured ROM:  5 to 90 sitting edge of bed  EXERCISE/ACTIVITY NAME REPETITIONS RESISTANCE COMPLETED THIS DOS   Reviewed HEP    X 10 (10 min)    no   NuStep 8 min L3 yes   KTC with therapy bal     x 10   no   Quad sets    2 x 10   n; HEP   Glut sets    2 x 10   n; HEP   SAQ      Active no; HEP   Ice and elevation    10-15 min while doing exercises (quad sets/glut sets, ankle pumps)   No: deferred to home   Seated heel slide X10    no   Forward/retro walk in bars; exaggerated gait  6 trips    y   Seated HS curl  10 x 5 sec hold  green  yes   Stair stretch flexion and extension X 5 each with  20sec hold   y   Step ups: fwd and lateral     Step downs X10 ea     x10 6 inch step     4 inch step Y     n   Leg press: DL K34 91# y   CP post treatment X10 min   y           Assessment: Tolerated new activities well.  He did have a little less ROM today sitting EOB.  However notes  stiffness this morning.  Pt progressing overall .      Short Term Goals: 4 Weeks               - Pt will improve his L knee PROM to 0-90 degrees.                            - Pt will improve his L leg strength to 3/5 in all muscle groups.               - Pt will walk with a cane and be able to ascend/descend steps 1 step at a time.               - Pt will improve his PSFS to 5/10.  Long Term Goals: 8 Weeks  - Pt will improve his L knee AROM to 0-110 degrees.   - Pt will have no quad lag with SLR x 10.                     - Pt will improve his L leg strength to 4/5 in all muscle groups.               - Pt will walk without an assistive device and ascend/descend a flight of regular steps reciprocally in the clinic without use of the rails.                - Pt will improve his PSFS to 7/10.             Plan: Will continue and progress activities as tolerated     Total Session Time 45, Timed code minutes 35, and Untimed code minutes 10  THERAPEUTIC EXERCISE 35 minutes      Lamin Chandley, PTA  06/07/2022, 09:00

## 2022-06-10 ENCOUNTER — Other Ambulatory Visit: Payer: Self-pay

## 2022-06-10 ENCOUNTER — Ambulatory Visit (HOSPITAL_COMMUNITY)
Admission: RE | Admit: 2022-06-10 | Discharge: 2022-06-10 | Disposition: A | Discharge status: Still In Hospital | Payer: 59 | Source: Ambulatory Visit

## 2022-06-10 NOTE — PT Treatment (Signed)
[facility] Medicine [facility]  Outpatient Physical Therapy  [address]  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  States soreness and stiffness continues but overall improving.  Feeling better most mornings but does get sore by end of the day.      Objective:  Warm up on Nustep followed by activities as noted below.  Ended with CP x 10    Measured ROM:     EXERCISE/ACTIVITY NAME REPETITIONS RESISTANCE COMPLETED THIS DOS   Reviewed HEP    X 10 (10 min)    no   NuStep 8 min L3 yes   KTC with therapy bal     x 10   no   Quad sets    2 x 10   n; HEP   Glut sets    2 x 10   n; HEP   SAQ      Active no; HEP   Ice and elevation    10-15 min while doing exercises (quad sets/glut sets, ankle pumps)   No: deferred to home   Seated heel slide X10    no   Forward/retro walk in bars; exaggerated gait  6 trips     y   Seated HS curl  10 x 5 sec hold  green  yes   Stair stretch flexion and extension X 5 each with  20sec hold   y   Step ups: fwd and lateral     Step downs X10 ea     x10 6 inch step     4 inch step Y     n   Leg press: DL V37 10# y   CP post treatment X10 min   y             Assessment: Pt tolerated fair.  Sharp shooting pains with movement from the leg press to sitting as well as with quad set.  Pt does have some redness distal/lateral incision.  Marked edges today for closer monitoring.  Pt reported some relief with use of ice    Short Term  Goals: 4 Weeks               - Pt will improve his L knee PROM to 0-90 degrees.                            - Pt will improve his L leg strength to 3/5 in all muscle groups.               - Pt will walk with a cane and be able to ascend/descend steps 1 step at a time.               - Pt will improve his PSFS to 5/10.     Long Term Goals: 8 Weeks  - Pt will improve his  L knee AROM to 0-110 degrees.   - Pt will have no quad lag with SLR x 10.                     - Pt will improve his L leg strength to 4/5 in all muscle groups.               - Pt will walk without an assistive device and ascend/descend a flight of regular steps reciprocally in the clinic without use of the rails.                - Pt will improve his PSFS to 7/10.        Plan: Will continue and progress as tolerted     Total Session Time 45, Timed code minutes 35, and Untimed code minutes 10  THERAPEUTIC EXERCISE 35 minutes      Elisa Kutner, PTA  06/10/2022, 11:04

## 2022-06-12 ENCOUNTER — Other Ambulatory Visit: Payer: Self-pay

## 2022-06-12 ENCOUNTER — Ambulatory Visit (HOSPITAL_COMMUNITY)
Admission: RE | Admit: 2022-06-12 | Discharge: 2022-06-12 | Disposition: A | Discharge status: Still In Hospital | Payer: 59 | Source: Ambulatory Visit

## 2022-06-12 NOTE — Progress Notes (Addendum)
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  He was reporting soreness and stiffness in the left knee but overall improving.  Feeling better most mornings but does get sore by end of the day.    Today, pt reported this past Mon he had a sharp pain in the L knee.  He thinks maybe he pulled something.  At rest pain = 0/10 and pain since Mon up to a 5/09 with certain movements.  Sharp pain - lateral and inferior to the patella.  Swelling noted in that area 10 cm length x 6 cm width.  Also having a pain over the medial condyle of the tibia (jumps with palpation when that area is touched).  Overall since Mon the pain is decreasing and swelling is decreasing.      During the session, PT called and spoke with the nurse/Dr. Branson's office.  The nurse recommended and spoke with the patient while he was here to "work him in for a follow up appointment".  They made a follow up appointment on 06/13/2022 at 1:15 PM with Dr. Arletha Pili office.      Patient-Specific Functional Score:  Problem Score at eval 06/12/2022   1. Go back to work  0 0   2. Walk without a device 0 0   3. Driving  0 0   Average 0 0         Objective:  Started by assessing knee pain from Monday, area of swelling, ROM, and palpation of the left knee.  Followed that by continuous knee ROM on the  Nustep to help the stiffiness in that left knee while therapist called the doctor's office.  Ended with CP x 10     AROM    Right at eval Left at eval 06/12/2022   Knee Flexion 92 (recumbent position)  67 (recumbent sitting position)  80 supine   Knee Extension 0-5-92  (Recumbent position)  -15 (recumbent sitting position)  0 supine      Strength    right left 06/12/2022   Hip flexion  5/5 3-/5 4/5   Hip abd   3/5   Hip extension  5/5 NT 3+/5   Knee flexion  5/5 3-/5 4/5   Knee extension  5/5 2+/5 4/5       Interventions listed in chart below were completed during the therapy session.    EXERCISE/ACTIVITY NAME REPETITIONS RESISTANCE COMPLETED THIS DOS   Reviewed HEP    X 10 (10 min)    no   NuStep 12 min L1 (no resistance so we could just get continuous ROM for flexion/extension in that knee today. yes   KTC with therapy bal     x 10   yes   Quad sets    2 x 10   n; HEP   Glut  sets    2 x 10   n; HEP   SAQ      Active no; HEP   LAQ X 20 (75% range due to pain today)   yes   Ice and elevation    10-15 min while doing exercises (quad sets/glut sets, ankle pumps)   No: deferred to home   Seated heel slide X10    no   Forward/retro walk in bars; exaggerated gait  6 trips    no   Seated HS curl  10 x 5 sec hold  green  no   Stair stretch flexion and extension X 5 each with  20sec hold   no   Step ups: fwd and lateral     Step downs X10 ea     x10 6 inch step     4 inch step no     n   Leg press: DL x15 80# no   CP post treatment X10 min   no      Access Code: [email or web address]  URL: [email or web address]  Date: 06/12/2022  Prepared by: Darrol Jump    Exercises  - Active Straight Leg Raise with Quad Set  - 1 x daily - 7 x weekly - 3 sets - 10 reps  - Modified Sidelying Hip Abduction  - 1 x daily - 7 x weekly - 3 sets - 10 reps        Assessment: PT is recommended that pt not do a lot of resisted activity with the left leg until he is cleared by the doctor for the current pain and swelling.  If doctor is not concerned about  current swelling and pain since Monday, pt can resume prior activities in therapy at that time.    PROGRESS NOTE - Pt has attended 7/24 visits.  Pt has made good progress.  Pt has met 2/4 STGs and 0/5 LTGs.  Pt can now walk with a cane but would still like to be able to functionally able to walk independently without any device.  Pt continues to demonstrate deficits in strength, ROM, and pain management that are preventing pt from reaching functional goals.  Pt would continue to benefit from skilled outpatient physical therapy services.        Short Term Goals: 4 Weeks               - Pt will improve his L knee PROM to 0-90 degrees. PROGRESSING, 06/12/2022                       - Pt will improve his L leg strength to 3/5 in all muscle groups. GOAL MET, 06/12/2022               - Pt will walk with a cane and be able to ascend/descend steps 1 step at a time.  GOAL MET, 06/12/2022               - Pt will improve his PSFS to 5/10. NOT MET, 06/12/2022     Long Term Goals: 8 Weeks  - Pt will improve his L knee AROM to 0-110 degrees. PROGRESSING, 06/12/2022  - Pt will have no quad lag with SLR x 10.   PROGRESSING, 06/12/2022                  - Pt will improve his L leg strength to 4/5 in all muscle groups.  PROGRESSING, 06/12/2022               -  Pt will walk without an assistive device and ascend/descend a flight of regular steps reciprocally in the clinic without use of the rails. PROGRESSING, 06/12/2022               - Pt will improve his PSFS to 7/10. NOT MET, 06/12/2022           Plan: Will continue and progress as tolerted        Total Session Time 45, Timed code minutes 35, and Untimed code minutes 10  THERAPEUTIC EXERCISE 35 minutes  Ice at the end with leg elevated for 10 min      Darrol Jump  06/12/2022, 11:01

## 2022-06-14 ENCOUNTER — Ambulatory Visit (HOSPITAL_COMMUNITY)
Admission: RE | Admit: 2022-06-14 | Discharge: 2022-06-14 | Disposition: A | Discharge status: Still In Hospital | Payer: 59 | Source: Ambulatory Visit

## 2022-06-14 ENCOUNTER — Other Ambulatory Visit: Payer: Self-pay

## 2022-06-14 NOTE — Progress Notes (Addendum)
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 Patient notes there are some decreases in swelling and redness today. He reports he has started driving some, but getting in and out of vehicle is challenging and then prolonged flexed position causes knee to ache.    Patient-Specific Functional Score:  Problem Score at eval 06/14/2022   1. Go back to work  0 5   2. Walk without a device 0 9   3. Driving  0 6   Average 0 6.66         Objective:  treatment delivered as noted below    AROM    Right at eval Left at eval 06/14/2022   Knee Flexion 92 (recumbent position)  67 (recumbent sitting position)  93 supine   Knee Extension 0-5-92  (Recumbent position)  -15 (recumbent sitting position)  0 supine      Strength    right left 06/12/2022   Hip flexion  5/5 3-/5 4/5   Hip abd   3/5   Hip extension  5/5 NT 3+/5   Knee flexion  5/5 3-/5 4/5   Knee extension  5/5 2+/5 4/5       Interventions listed in chart below were completed during the therapy session.    EXERCISE/ACTIVITY NAME REPETITIONS RESISTANCE COMPLETED THIS DOS   Reviewed HEP    X 10 (10 min)    no   NuStep 10 min L1 (no resistance so we could just get continuous ROM for flexion/extension in that knee today. yes   KTC with therapy bal     x 10    yes   Quad sets    2 x 10   n; HEP   Glut sets    2 x 10   n; HEP   SAQ      Active no; HEP   LAQ X 20 (75% range due to pain today)   yes   Ice and elevation    10-15 min while doing exercises (quad sets/glut sets, ankle pumps)   No: deferred to home   Seated heel slide X10    no   Forward/retro walk in bars; exaggerated gait  6 trips    no   Seated HS curl  10 x 5 sec hold  green  no   Stair stretch flexion and extension X 5 each with  10 sec hold   no   Step ups: fwd and lateral     Step downs X10 ea     x10 6 inch step     4 inch step y     n   Leg press: DL x15 80# no   CP post treatment X10 min   y  Assessment: During end of nustep time he reports pain medial, after performing extension stretch at stairs he states that resolved the "catch" he felt medial aspect of the knee. He has empty end feel with flexion and was instructed to work into his end range at home.     Short Term Goals: 4 Weeks               - Pt will improve his L knee PROM to 0-90 degrees. PROGRESSING, 06/12/2022                       - Pt will improve his L leg strength to 3/5 in all muscle groups. GOAL MET, 06/12/2022               - Pt will walk with a cane and be able to ascend/descend steps 1 step at a time.  GOAL MET, 06/12/2022               - Pt will improve his PSFS to 5/10. NOT MET, 06/12/2022     Long Term Goals: 8 Weeks  - Pt will improve his L knee AROM to 0-110 degrees. PROGRESSING, 06/12/2022  - Pt will have no quad lag with SLR x 10.   PROGRESSING, 06/12/2022                  - Pt will improve his L leg strength to 4/5 in all muscle groups.  PROGRESSING, 06/12/2022               - Pt will walk without an assistive device and ascend/descend a flight of regular steps reciprocally in the clinic without use of the rails. PROGRESSING, 06/12/2022               - Pt will improve his PSFS to 7/10. NOT MET, 06/12/2022           Plan:  Will re-assess if he has made flexion gains next visit.      Total Session Time 45, Timed code minutes  35, and Untimed code minutes 10  THERAPEUTIC EXERCISE 35 minutes    Benjie Karvonen, PTA  06/14/2022 17:12            Total Session Time 45, Timed code minutes 35, and Untimed code minutes 10  THERAPEUTIC EXERCISE 35 minutes  Ice at the end with leg elevated for 10 min      Darrol Jump  06/12/2022, 11:01

## 2022-06-17 ENCOUNTER — Other Ambulatory Visit: Payer: Self-pay

## 2022-06-17 ENCOUNTER — Ambulatory Visit (HOSPITAL_COMMUNITY)
Admission: RE | Admit: 2022-06-17 | Discharge: 2022-06-17 | Disposition: A | Discharge status: Still In Hospital | Payer: 59 | Source: Ambulatory Visit

## 2022-06-17 NOTE — PT Treatment (Signed)
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  He reports that his knee gets really stiff when he is sitting for a long period of time.      Patient-Specific Functional Score:  Problem Score at eval 06/14/2022   1. Go back to work  0 5   2. Walk without a device 0 9   3. Driving  0 6   Average 0 6.66         Objective:  treatment delivered as noted below     AROM    Right at eval Left at eval 06/14/2022 06/17/2022   Knee Flexion 92 (recumbent position)  67 (recumbent sitting position)  93 supine 93 supine   Knee Extension 0-5-92  (Recumbent position)  -15 (recumbent sitting position)  0 supine 0 supine      Strength    right left 06/12/2022   Hip flexion  5/5 3-/5 4/5   Hip abd     3/5   Hip extension  5/5 NT 3+/5   Knee flexion  5/5 3-/5 4/5   Knee extension  5/5 2+/5 4/5       Interventions listed in chart below were completed during the therapy session.    EXERCISE/ACTIVITY NAME REPETITIONS RESISTANCE COMPLETED THIS DOS   Reviewed HEP    X 10 (10 min)    no   NuStep 10 min L1 (no resistance so we could just get continuous ROM for flexion/extension in that knee today. no   Bike  5 min  yes   KTC with therapy bal     x 10   yes   Quad sets    2 x 10   n; HEP   Glut sets    2 x 10   n; HEP   Patellar mobs X 10-20  Yes; HEP   SAQ      Active no; HEP   LAQ X 20 (75% range due to pain today)     no   SLR with quad set  X 20   Yes; HEP   Manual therapy  (PT noted a lot of swelling - thoughts are that the swelling is pushing the patella medially and causing the pain/catch - Lymphadema massage to improve movement pattern)  Lymphedema massage to get fluid off the lateral side of the knee;  10 min  yes   Ice and elevation    10-15 min while doing exercises (quad sets/glut sets, ankle pumps)   Yes: deferred to home   Seated heel slide X10    no   Forward/retro walk in bars; exaggerated gait  6 trips    no   Seated HS curl  10 x 5 sec hold  green  no   Stair stretch flexion and extension X 5 each with  10 sec hold   no   Step ups: fwd  and lateral     Step downs X10 ea     x10 6 inch step     4 inch step no     n   Leg press: DL x15 80# no   CP post treatment X10 min   Yes(while therapist was printing and finding HEP        Assessment: Pt is having a "catch" in his left knee.  PT's assessment is that the swelling is causing the knee cap to ride laterally.  PT treated this with lymphadema message to push fluid up into the leg out of the knee joint.  Pt has Kinesio tape from last session.  This tape is good but next taping PT would recommend trailing the tap laterally to pull knee cap medially (patellar glide medially with 25-50% tension).  PT would recommend continuing with lymphedema massage, patellar mobs, and Kinesio tape for pain management/patellar alignment.      HEP:   Access Code: [email or web address]  URL: [email or web address]  Date: 06/17/2022  Prepared by: Darrol Jump    Exercises  - 4 Way Patellar Glide  - 1 x daily - 7 x weekly - 3 sets - 10 reps  - Active Straight Leg Raise with Quad Set  - 1 x daily - 7 x weekly - 3 sets - 10 reps     Short Term Goals: 4 Weeks               - Pt will improve his L knee PROM to 0-90 degrees. PROGRESSING, 06/12/2022                       - Pt will improve his L leg strength to 3/5 in all muscle groups. GOAL MET, 06/12/2022               - Pt will walk with a cane and be  able to ascend/descend steps 1 step at a time.  GOAL MET, 06/12/2022               - Pt will improve his PSFS to 5/10. NOT MET, 06/12/2022     Long Term Goals: 8 Weeks  - Pt will improve his L knee AROM to 0-110 degrees. PROGRESSING, 06/12/2022  - Pt will have no quad lag with SLR x 10.   PROGRESSING, 06/12/2022                  - Pt will improve his L leg strength to 4/5 in all muscle groups.  PROGRESSING, 06/12/2022               - Pt will walk without an assistive device and ascend/descend a flight of regular steps reciprocally in the clinic without use of the rails. PROGRESSING, 06/12/2022               - Pt will improve his PSFS to 7/10. NOT MET, 06/12/2022           Plan:  Will re-assess if he has made flexion gains next visit.       Total Session Time 50 and Timed code minutes 50  THERAPEUTIC EXERCISE 40 minutes and JOINT MOBILIZATION/MFR 10 minutes      Darrol Jump  06/17/2022, 08:52

## 2022-06-19 ENCOUNTER — Ambulatory Visit (HOSPITAL_COMMUNITY)
Admission: RE | Admit: 2022-06-19 | Discharge: 2022-06-19 | Disposition: A | Discharge status: Still In Hospital | Payer: 59 | Source: Ambulatory Visit

## 2022-06-19 ENCOUNTER — Other Ambulatory Visit: Payer: Self-pay

## 2022-06-19 NOTE — PT Treatment (Signed)
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 States that his knee continues to feel a "catch" on the inside of knee. States that it is still difficult to  up from his couch.     Patient-Specific Functional Score:  Problem Score at eval 06/14/2022   1. Go back to work  0 5   2. Walk without a device 0 9   3. Driving  0 6   Average 0 6.66         Objective:  treatment delivered as noted below     AROM    Right at eval Left at eval 06/14/2022 06/17/2022   Knee Flexion 92 (recumbent position)  67 (recumbent sitting position)  93 supine 93 supine   Knee Extension 0-5-92  (Recumbent position)  -15 (recumbent sitting position)  0 supine 0 supine      Strength    right left 06/12/2022   Hip flexion  5/5 3-/5 4/5   Hip abd     3/5   Hip extension  5/5 NT 3+/5   Knee flexion  5/5 3-/5 4/5   Knee extension  5/5 2+/5 4/5       Interventions listed in chart below were completed during the therapy session.    EXERCISE/ACTIVITY NAME REPETITIONS RESISTANCE COMPLETED THIS DOS   Reviewed HEP    X 10 (10 min)    no   NuStep 5 min L3 yes   Bike  5 min   no   KTC with therapy bal     x 10   no   Quad sets    2 x 10   n; HEP   Glut sets    2 x 10   n; HEP   Patellar mobs X 10-20   Yes; HEP   SAQ      Active no; HEP   LAQ X 20 (75% range due to pain today)    no   SLR with quad set  X 20     no; HEP   Manual therapy  (PT noted a lot of swelling - thoughts are that the swelling is pushing the patella medially and causing the pain/catch - Lymphadema massage to improve movement pattern)  Lymphedema massage to get fluid off the lateral side of the knee;  10 min   yes   Ice and elevation    10-15 min while doing exercises (quad sets/glut sets, ankle pumps)   N: deferred to home   Seated heel slide X10    no   Forward/retro walk in bars; exaggerated gait  6 trips    no   Seated HS curl  10 x 5 sec hold  green  no   Stair stretch flexion and extension X 5 each with  10 sec hold   no  Step ups: fwd and lateral     Step downs X10 ea     x10 6 inch step     4 inch step no     n   Leg press: DL x15 80# no   CP post treatment X10 min   no(while therapist was printing and finding HEP    KT  Patellar glide (medial)   y       Assessment: Noted  "catch" in his left knee and pain  had decreased following treatment.  Decreased swelling in knee following lymphedema massage. Improved in patella mobility following mobs.          Short Term Goals: 4 Weeks               - Pt will improve his L knee PROM to 0-90 degrees. PROGRESSING, 06/12/2022                       - Pt will improve his L leg strength to 3/5 in all muscle groups. GOAL MET, 06/12/2022               - Pt will walk with a cane and be able to ascend/descend steps 1 step at a time.  GOAL MET, 06/12/2022               - Pt will improve his PSFS to 5/10. NOT MET, 06/12/2022     Long Term Goals: 8 Weeks  - Pt will improve his L knee AROM to 0-110 degrees. PROGRESSING, 06/12/2022  - Pt will have no quad lag with SLR x 10.   PROGRESSING, 06/12/2022                  - Pt will improve his L leg strength to 4/5 in all muscle groups.  PROGRESSING, 06/12/2022               - Pt will walk without an assistive device and ascend/descend a flight of regular steps reciprocally in the clinic without use of the rails. PROGRESSING, 06/12/2022               - Pt will improve his PSFS to  7/10. NOT MET, 06/12/2022           Plan:  Continue with lymphedema massage, patella mobs, and Kt tape. Progress ther ex as tolerated. Assess knee flexion AROM.           Total Session Time 30 and Timed code minutes 30  THERAPEUTIC EXERCISE 10 minutes and JOINT MOBILIZATION/MFR 20 minutes KT time included with ther ex      Carlton Adam, PTA  06/19/2022, 08:02

## 2022-06-20 ENCOUNTER — Ambulatory Visit
Admission: RE | Admit: 2022-06-20 | Discharge: 2022-06-20 | Disposition: A | Discharge status: Still In Hospital | Payer: 59 | Source: Ambulatory Visit | Attending: Orthopaedic Surgery | Admitting: Orthopaedic Surgery

## 2022-06-20 NOTE — PT Treatment (Signed)
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      Patient-Specific Functional Score:  Problem Score at eval 06/14/2022   1. Go back to work  0 5   2. Walk without a device 0 9   3. Driving  0 6   Average 0 6.66         Objective:  treatment delivered as noted below     AROM assessed in supine and 100 degrees prior to edema massage and stretching. 102 degrees was with effort and VC.     Right at eval Left at eval 06/14/2022 06/20/2022   Knee Flexion 92 (recumbent position)  67 (recumbent sitting position)  93 supine 102 supine   Knee Extension 0-5-92  (Recumbent position)  -15 (recumbent sitting position)  0 supine 0 supine: not assessee      Strength    right left 06/12/2022   Hip flexion  5/5 3-/5 4/5   Hip abd     3/5   Hip extension  5/5 NT 3+/5   Knee flexion  5/5 3-/5 4/5   Knee extension  5/5 2+/5 4/5       Interventions listed in chart below were completed during the therapy session.    EXERCISE/ACTIVITY NAME REPETITIONS RESISTANCE COMPLETED THIS DOS   Reviewed HEP    X 10 (10 min)    no   NuStep 8 min L3 yes   Bike  5 min   no   KTC with therapy bal     x 10   no   Quad sets    2 x 10   n; HEP   Glut sets    2 x 10   n; HEP   Patellar mobs X 10-20   n; HEP   SAQ      Active no; HEP   LAQ X 20 (75% range due to pain  today)    no   SLR with quad set  X 20    no; HEP   Manual therapy  (PT noted a lot of swelling - thoughts are that the swelling is pushing the patella medially and causing the pain/catch - Lymphadema massage to improve movement pattern)  Lymphedema massage to get fluid off the lateral side of the knee;  15 min  time includes scar massage also yes   Ice and elevation    10-15 min while doing exercises (quad sets/glut sets, ankle pumps)   N: deferred to home   Seated heel slide X10    no   Forward/retro walk in bars; exaggerated gait  6 trips    no   Seated HS curl  10 x 5 sec hold  green  no   Stair stretch flexion and extension X 5 each with  10 sec hold   no   Step ups: fwd and lateral       step downs X15 ea: resistance using tube with fwd     x10 6 inch step        6 inch step y       y   Leg press: DL x25 80# y   CP post treatment X10 min   no(while therapist was printing and finding HEP    KT  Patellar glide (medial)   n       Assessment: Patient does have swelling that inhibits flexion, however he is greatly self limiting especially with manual stretching. Empty end feel noted with passive flexion stretching. Did not re-apply kinesio tape this visit.     Short Term Goals: 4 Weeks               - Pt will improve his L knee PROM to 0-90 degrees. PROGRESSING, 06/12/2022                       - Pt will improve his L leg strength to 3/5 in all muscle groups. GOAL MET, 06/12/2022               - Pt will walk with a cane and be able to ascend/descend steps 1 step at a time.  GOAL MET, 06/12/2022               - Pt will improve his PSFS to 5/10. NOT MET, 06/12/2022     Long Term Goals: 8 Weeks  - Pt will improve his L knee AROM to 0-110 degrees. PROGRESSING, 06/12/2022  - Pt will have no quad lag with SLR x 10.   PROGRESSING, 06/12/2022                  - Pt will improve his L leg strength to 4/5 in all muscle groups.  PROGRESSING, 06/12/2022               - Pt will walk without an assistive device and ascend/descend a  flight of regular steps reciprocally in the clinic without use of the rails. PROGRESSING, 06/12/2022               - Pt will improve his PSFS to 7/10. NOT MET, 06/12/2022           Plan:  Assess if he feels kinesio tape needs re-application.       Total Session Time 41 and Timed code minutes 41  THERAPEUTIC EXERCISE 26 minutes and JOINT MOBILIZATION/MFR 15 minutes    Steven Juarez, PTA  06/20/2022 09:05

## 2022-06-21 ENCOUNTER — Ambulatory Visit (HOSPITAL_COMMUNITY): Payer: Self-pay

## 2022-06-24 ENCOUNTER — Ambulatory Visit (HOSPITAL_COMMUNITY)
Admission: RE | Admit: 2022-06-24 | Discharge: 2022-06-24 | Disposition: A | Discharge status: Still In Hospital | Payer: 59 | Source: Ambulatory Visit

## 2022-06-24 ENCOUNTER — Other Ambulatory Visit: Payer: Self-pay

## 2022-06-24 DIAGNOSIS — M25562 Pain in left knee: Secondary | ICD-10-CM | POA: Insufficient documentation

## 2022-06-24 NOTE — PT Treatment (Signed)
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   Continuing to improve     Patient-Specific Functional Score:  Problem Score at eval 06/14/2022   1. Go back to work  0 5   2. Walk without a device 0 9   3. Driving  0 6   Average 0 6.66         Objective:  treatment delivered as noted below       Right at eval Left at eval 06/14/2022 06/20/2022 06/24/2022   Knee Flexion 92 (recumbent position)  67 (recumbent sitting position)  93 supine 102 supine 95 supine   Knee Extension 0-5-92  (Recumbent position)  -15 (recumbent sitting position)  0 supine 0 supine: not assessee 0 supine      Strength    right left 06/12/2022   Hip flexion  5/5 3-/5 4/5   Hip abd     3/5   Hip extension  5/5 NT 3+/5   Knee flexion  5/5 3-/5 4/5   Knee extension  5/5 2+/5 4/5       Interventions listed in chart below were completed during the therapy session.    EXERCISE/ACTIVITY NAME REPETITIONS RESISTANCE COMPLETED THIS DOS   Reviewed HEP    X 10 (10 min)    no   NuStep 8 min L5 yes   Bike  5 min   no   KTC with therapy bal     x 10   no   Quad sets    2 x 10   n; HEP   Glut sets    2 x 10   n; HEP   Patellar mobs X 10-20   n; HEP   SAQ      Active no; HEP   LAQ X 20 (75% range due to pain today)    no   SLR with quad set  X 20    no; HEP   Manual  therapy  (PT noted a lot of swelling - thoughts are that the swelling is pushing the patella medially and causing the pain/catch - Lymphadema massage to improve movement pattern)  Lymphedema massage to get fluid off the lateral side of the knee;  15 min  time includes scar massage also yes   Ice and elevation    10-15 min while doing exercises (quad sets/glut sets, ankle pumps)   yes   Seated heel slide X10    no   Forward/retro walk in bars; exaggerated gait  6 trips    no   Seated HS curl  10 x 5 sec hold  green  no   Stair stretch flexion and extension X 5 each with  10 sec hold   no   Step ups: fwd and lateral        step  downs X15 ea: resistance using tube with fwd     x10 6 inch step         6 inch step no        no   Leg press: DL x25 80# no   CP post treatment X10 min   yes   KT  Patellar glide (medial)     yes       Assessment: Pt tolerated treatment well.  Pt continues to benefit from therapy.      Short Term Goals: 4 Weeks               - Pt will improve his L knee PROM to 0-90 degrees. PROGRESSING, 06/12/2022                       - Pt will improve his L leg strength to 3/5 in all muscle groups. GOAL MET, 06/12/2022               - Pt will walk with a cane and be able to ascend/descend steps 1 step at a time.  GOAL MET, 06/12/2022               - Pt will improve his PSFS to 5/10. NOT MET, 06/12/2022     Long Term Goals: 8 Weeks  - Pt will improve his L knee AROM to 0-110 degrees. PROGRESSING, 06/12/2022  - Pt will have no quad lag with SLR x 10.   PROGRESSING, 06/12/2022                  - Pt will improve his L leg strength to 4/5 in all muscle groups.  PROGRESSING, 06/12/2022               - Pt will walk without an assistive device and ascend/descend a flight of regular steps reciprocally in the clinic without use of the rails. PROGRESSING, 06/12/2022               - Pt will improve his PSFS to 7/10. NOT MET, 06/12/2022     Plan:  Continue with POC.  Pt candecrease frequency to 2-3x/week as pt and therapist feel  is appropriate.  Scheduling 2x next week due to the holiday.      Total Session Time 35, Timed code minutes 35, and Untimed code minutes 10  THERAPEUTIC EXERCISE 10 minutes and JOINT MOBILIZATION/MFR 25 minutes  Ice following session for 10 min (not billed)       Darrol Jump  06/24/2022, 09:18

## 2022-06-26 ENCOUNTER — Ambulatory Visit (HOSPITAL_COMMUNITY)
Admission: RE | Admit: 2022-06-26 | Discharge: 2022-06-26 | Disposition: A | Discharge status: Still In Hospital | Payer: 59 | Source: Ambulatory Visit

## 2022-06-26 ENCOUNTER — Other Ambulatory Visit: Payer: Self-pay

## 2022-06-26 NOTE — PT Treatment (Signed)
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  States he did some driving yesterday and feels like that made him stiff.  Notes most pain on inside of knee joint.  States once that gets worked out usually feels better.   Does state that massage has helped with swelling     Objective: Warm up on Nustep followed by activities as noted below.      Measured ROM: not measured today.      EXERCISE/ACTIVITY NAME REPETITIONS RESISTANCE COMPLETED THIS DOS   Reviewed HEP    X 10 (10 min)    no   NuStep 8 min L5 yes   Bike  5 min   no   KTC with therapy bal     x 10   no   Quad sets    2 x 10   n; HEP   Glut sets    2 x 10   n; HEP   Patellar mobs   R sidelying focused on medial glide/ITB/ Retinaculum stretch Y; HEP   SAQ      Active no; HEP   LAQ X 20 (75% range due to pain today)    no   SLR with quad set  X 20    no; HEP   Manual therapy  (PT noted a lot of swelling - thoughts are that the swelling is pushing the patella medially and causing the pain/catch - Lymphadema massage to improve movement pattern)  Lymphedema massage to get fluid off the lateral side of the knee;  15 min  time includes scar massage also no   Ice and elevation    10-15 min while doing exercises (quad sets/glut sets, ankle pumps)   yes   Seated heel slide X10    no   Forward/retro walk in bars; exaggerated  gait  6 trips    no   Seated HS curl  10 x 5 sec hold  green  no   Stair stretch flexion and extension X 5 each with  10 sec hold   no   Step ups: fwd and lateral        step downs X15 ea: resistance using tube with fwd     x10 6 inch step         6 inch step Yes, forward         yes   Leg press: DL x25 80# no   CP post treatment X10 min   no   KT  Patellar glide (medial)    added Y tape for medial glide yes           Assessment: Pt tolerated well.  Noted good relief after session.  Discussed more supportive shoes or possibly arch support.  Pt does have slight over pronation with FWB  Short Term Goals: 4 Weeks               - Pt will improve his L knee PROM to 0-90 degrees. PROGRESSING, 06/12/2022                       - Pt will improve his L leg strength to 3/5 in all muscle groups. GOAL MET, 06/12/2022               - Pt will walk with a cane and be able to ascend/descend steps 1 step at a time.  GOAL MET, 06/12/2022               - Pt will improve his PSFS to 5/10. NOT MET, 06/12/2022     Long Term Goals: 8 Weeks  - Pt will improve his L knee AROM to 0-110 degrees. PROGRESSING, 06/12/2022  - Pt will have no quad lag with SLR x 10.   PROGRESSING, 06/12/2022                  - Pt will improve his L leg strength to 4/5 in all muscle groups.  PROGRESSING, 06/12/2022               - Pt will walk without an assistive device and ascend/descend a flight of regular steps reciprocally in the clinic without use of the rails. PROGRESSING, 06/12/2022               - Pt will improve his PSFS to 7/10. NOT MET, 06/12/2022         Plan: Monitor effects of treatment and proceed accordingly    Total Session Time 37 and Timed code minutes 37  THERAPEUTIC EXERCISE 20 minutes and JOINT MOBILIZATION/MFR 17 minutes      Jadd Gasior, PTA  06/26/2022, 07:53

## 2022-06-28 ENCOUNTER — Ambulatory Visit (HOSPITAL_COMMUNITY)
Admission: RE | Admit: 2022-06-28 | Discharge: 2022-06-28 | Disposition: A | Discharge status: Still In Hospital | Payer: 59 | Source: Ambulatory Visit

## 2022-06-28 ENCOUNTER — Other Ambulatory Visit: Payer: Self-pay

## 2022-06-28 NOTE — PT Treatment (Signed)
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  Notes he does get better as day goes on.  Swelling has improved and notes tape did help.  However has started to itch.      Objective: Warm up on Nustep followed by activities as noted below.  Ended session with CP.  Removed tape d/t itching    Measured ROM:     EXERCISE/ACTIVITY NAME REPETITIONS RESISTANCE COMPLETED THIS DOS   Reviewed HEP    X 10 (10 min)    no   NuStep 8 min L5 yes   Bike  5 min   no   KTC with therapy bal     x 10   no   Quad sets    2 x 10   n; HEP   Glut sets    2 x 10   n; HEP   Patellar mobs    R sidelying focused on medial glide/ITB/ Retinaculum stretch Y; HEP   SAQ      Active no; HEP   LAQ X 20 (75% range due to pain today)    no   SLR with quad set  X 20    no; HEP   Manual therapy  (PT noted a lot of swelling - thoughts are that the swelling is pushing the patella medially and causing the pain/catch - Lymphadema massage to improve movement pattern)  Lymphedema massage to get fluid off the lateral side of the knee;  15 min  time includes scar massage also no   Ice and elevation    10-15 min   yes   Seated heel slide X10    no   Forward/retro walk in bars; exaggerated gait  6 trips    no   Seated HS curl  10 x 5 sec hold  green  no   Stair stretch flexion and extension X 5 each with   10 sec hold   no   Step ups: fwd and lateral        step downs X15 ea: resistance using tube with fwd     x10 6 inch step         6 inch step Yes, forward         yes   Leg press: DL   Single leg press 3x10   2x10 120#  60# Yes   yes   CP post treatment X10 min   no   KT  Patellar glide (medial)    added Y tape for medial glide no              Assessment: Pt tolerated well.  Single leg press "felt good" and pt noted good relief of stiffness after session.  Attempted hip abduction machine but pt noted pain with the  position so stopped this activity    Short Term Goals: 4 Weeks               - Pt will improve his L knee PROM to 0-90 degrees. PROGRESSING, 06/12/2022                       - Pt will improve his L leg strength to 3/5 in all muscle groups. GOAL MET, 06/12/2022               - Pt will walk with a cane and be able to ascend/descend steps 1 step at a time.  GOAL MET, 06/12/2022               - Pt will improve his PSFS to 5/10. NOT MET, 06/12/2022     Long Term Goals: 8 Weeks  - Pt will improve his L knee AROM to 0-110 degrees. PROGRESSING, 06/12/2022  - Pt will have no quad lag with SLR x 10.   PROGRESSING, 06/12/2022                  - Pt will improve his L leg strength to 4/5 in all muscle groups.  PROGRESSING, 06/12/2022               - Pt will walk without an assistive device and ascend/descend a flight of regular steps reciprocally in the clinic without use of the rails. PROGRESSING, 06/12/2022               - Pt will improve his PSFS to 7/10. NOT MET, 06/12/2022         Plan: Will continue and progress as tolerated     Total Session Time 45, Timed code minutes 35, and Untimed code minutes 10  THERAPEUTIC EXERCISE 35 minutes      Lliam Hoh, PTA  06/28/2022, 07:59

## 2022-07-02 ENCOUNTER — Ambulatory Visit (HOSPITAL_COMMUNITY)
Admission: RE | Admit: 2022-07-02 | Discharge: 2022-07-02 | Disposition: A | Discharge status: Still In Hospital | Payer: 59 | Source: Ambulatory Visit

## 2022-07-02 ENCOUNTER — Other Ambulatory Visit: Payer: Self-pay

## 2022-07-02 NOTE — PT Treatment (Signed)
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  Still just one spot that hurts medial knee joint line.  States sometimes it is sharp shooting pain.  Otherwise usually does well.      Objective: Warm up on Nustep followed by activities as noted below.      Measured ROM:       EXERCISE/ACTIVITY NAME REPETITIONS RESISTANCE COMPLETED THIS DOS   Reviewed HEP    X 10 (10 min)    no   NuStep 7 min L5 yes   Bike  5 min   no   KTC with therapy bal     x 10   no   Quad sets    2 x 10   n; HEP   Glut sets    2 x 10   n; HEP   Patellar mobs    R sidelying focused on medial glide/ITB/ Retinaculum stretch Y; HEP   SAQ      Active no; HEP   LAQ X 20   3# yes   SLR with quad set  X 20    yes; HEP   Manual therapy  (PT noted a lot of swelling - thoughts are that the swelling is pushing the patella medially and causing the pain/catch - Lymphadema massage to improve movement pattern)  Lymphedema massage to get fluid off the lateral side of the knee;  15 min  time includes scar massage also no   Ice and elevation    10-15 min   no   Seated heel slide X10    no   Forward/retro walk in bars; exaggerated gait  6 trips    no   Seated HS curl  10 x 5 sec hold  green  no   Stair stretch flexion and extension X 5 each with  10 sec hold   no   Step ups: fwd and lateral        step downs X15 ea: resistance using  tube with fwd     x10 6 inch step         6 inch step Yes, forward         yes   Leg press: DL   Single leg press 3x10   2x10 120#  60# Yes   yes   CP post treatment X10 min   no   KT  Patellar glide (medial)    added Y tape for medial glide no            Assessment:  Pt tolerated treatment well.  Continues to have one area of pain medial knee joint.  Some weakness noted but with improvement      Short Term Goals: 4 Weeks               - Pt will improve  his L knee PROM to 0-90 degrees. PROGRESSING, 06/12/2022                       - Pt will improve his L leg strength to 3/5 in all muscle groups. GOAL MET, 06/12/2022               - Pt will walk with a cane and be able to ascend/descend steps 1 step at a time.  GOAL MET, 06/12/2022               - Pt will improve his PSFS to 5/10. NOT MET, 06/12/2022     Long Term Goals: 8 Weeks  - Pt will improve his L knee AROM to 0-110 degrees. PROGRESSING, 06/12/2022  - Pt will have no quad lag with SLR x 10.   PROGRESSING, 06/12/2022                  - Pt will improve his L leg strength to 4/5 in all muscle groups.  PROGRESSING, 06/12/2022               - Pt will walk without an assistive device and ascend/descend a flight of regular steps reciprocally in the clinic without use of the rails. PROGRESSING, 06/12/2022               - Pt will improve his PSFS to 7/10. NOT MET, 06/12/2022            Plan: Will continue and progress as tolerated.      Total Session Time 40 and Timed code minutes 40  THERAPEUTIC EXERCISE 40 minutes      Solara Goodchild, PTA  07/02/2022, 08:00

## 2022-07-04 ENCOUNTER — Ambulatory Visit
Admission: RE | Admit: 2022-07-04 | Discharge: 2022-07-04 | Disposition: A | Discharge status: Still In Hospital | Payer: 59 | Source: Ambulatory Visit

## 2022-07-04 ENCOUNTER — Other Ambulatory Visit: Payer: Self-pay

## 2022-07-04 NOTE — PT Treatment (Signed)
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  Still just one spot that hurts medial knee joint line.  States sometimes it is sharp shooting pain.  Otherwise usually does well.  Still having one spot that is swollen and painful.  Saw his doctor on 07/03/2022 who wanted him to continue at this time.  Next doctor appointment is 07/17/2022.      Objective: Warm up on Nustep followed by activities as noted below.       Measured ROM:   In supine AROM L = 0-100 degrees     EXERCISE/ACTIVITY NAME REPETITIONS RESISTANCE COMPLETED THIS DOS   Reviewed HEP    X 10 (10 min)    no   NuStep 8 min L5 yes   Bike  5 min   no   KTC with therapy bal     x 10   no   Quad sets    2 x 10   n; HEP   Glut sets    2 x 10   n; HEP   Patellar mobs    R sidelying focused on medial glide/ITB/ Retinaculum stretch no; HEP   SAQ      Active no; HEP   LAQ X 20   3# yes   SLR with quad set  X 20    yes; HEP   Manual therapy  (PT noted a lot of swelling - thoughts are that the swelling is pushing the patella medially and causing the pain/catch - Lymphadema massage to improve movement pattern)  Lymphedema massage to get fluid off the lateral side of the knee;  15 min  time includes scar massage also no   Ice and elevation    10-15 min    no   Seated heel slide X10    no   Forward/retro walk in bars; exaggerated gait  6 trips    no   Seated HS curl  10 x 5 sec hold  green  no   Stair stretch flexion and extension X 5 each with  10 sec hold   no   Step ups: fwd and lateral  step downs X30    x30 8 inch step   inch step Yes, forward   yes   Leg press: DL   Single leg press 3x10  3 x 10 120#  60# yes  yes   CP post treatment X10 min   no   KT  Patellar glide (medial)  (on 07/04/2022 - kept "U" tape tape under patella from last tape - doffed medial piece, and added lymphatic tape for knee swelling  added Y tape for medial glide Y (taping before was good and can be continued - tried today with Lymphatic tape to see if this helps the  swelling)        Assessment:  Discussed that pt is doing well.  No longer using an assistive device (1-2 weeks without cane) and is doing well.  Not back to work yet.  A little weak stepping down steps (eccentric control on the left).  Pt continues to benefit from skilled outpatient physical therapy services.      Short Term Goals: 4 Weeks               - Pt will improve his L knee PROM to 0-90 degrees. GOAL MET, 07/04/2022               - Pt will improve his L leg strength to 3/5 in all muscle groups. GOAL MET, 06/12/2022               - Pt will walk with a cane and be able to ascend/descend steps 1 step at a time.  GOAL MET, 06/12/2022               - Pt will improve his PSFS to 5/10. NOT MET, 06/12/2022     Long Term Goals: 8 Weeks  - Pt will improve his L knee AROM to 0-110 degrees. PROGRESSING, 07/04/2022  - Pt will have no quad lag with SLR x 10.   PROGRESSING, 06/12/2022                  - Pt will improve his L leg strength to 4/5 in all muscle groups.  PROGRESSING, 06/12/2022               - Pt will walk without an assistive device and ascend/descend a flight of regular steps reciprocally in the clinic without use of the rails. PROGRESSING, 07/04/2022               - Pt will improve his PSFS to 7/10. NOT MET, 06/12/2022      Plan: Will continue and progress as tolerated.  Pt is doing really well.  Planning to continue for 2 weeks and then determine if he will need more therapy at that time.  PT will pla to see him 07/18/2022 for a progress note/discharge summary at that time.      Total Session Time 45 and Timed code minutes 45  THERAPEUTIC EXERCISE 30 minutes and NEURO RE-EDUCATION 15MINUTES      Darrol Jump  07/04/2022, 08:43

## 2022-07-09 ENCOUNTER — Ambulatory Visit (HOSPITAL_COMMUNITY): Payer: Self-pay

## 2022-07-11 ENCOUNTER — Ambulatory Visit (HOSPITAL_COMMUNITY)
Admission: RE | Admit: 2022-07-11 | Discharge: 2022-07-11 | Disposition: A | Discharge status: Still In Hospital | Payer: 59 | Source: Ambulatory Visit

## 2022-07-11 ENCOUNTER — Other Ambulatory Visit: Payer: Self-pay

## 2022-07-11 NOTE — PT Treatment (Signed)
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 States that he is now able to put his socks/shoes on. States he will have times when the swelling in his knee increases. States that he continues to have that one spot on the inside spot that hurts. States that he had to take the KT off d/t itching and breaking out.     Objective: Warm up on Nustep followed by activities as noted below.       Measured ROM:   In supine AROM L = 0-100 degrees measured prior visit     EXERCISE/ACTIVITY NAME REPETITIONS RESISTANCE COMPLETED THIS DOS   Reviewed HEP    X 10 (10 min)    no   NuStep 8 min L5 yes   Bike  5 min   no   KTC with therapy bal     x 10   no   Quad sets    2 x 10   n; HEP   Glut sets    2 x 10   n; HEP   Patellar mobs    R sidelying focused on medial glide/ITB/ Retinaculum stretch no; HEP   SAQ      Active no; HEP   LAQ X 20   3# yes   SLR with quad set  X 20    yes; HEP   Manual therapy  (PT noted a lot of swelling - thoughts are that the swelling is pushing the patella medially and causing the pain/catch - Lymphadema massage to improve movement pattern)  Lymphedema massage to get fluid off the lateral side of the knee;  15 min  time includes scar massage also no   Ice and elevation     10-15 min   no   Seated heel slide X10    no   Forward/retro walk in bars; exaggerated gait  6 trips    no   Seated HS curl  10 x 5 sec hold  green  no   Stair stretch flexion and extension X 5 each with  10 sec hold   no   Step ups: fwd and lateral  step downs X15  x15 8 inch step   inch step Yes, forward   yes   Leg press: DL   Single leg press 2x15  2x15 120#  60# yes  yes   CP post treatment X10 min   no   KT  Patellar glide (medial)  (on 07/04/2022 - kept "U" tape tape under patella from last tape - doffed medial piece, and added lymphatic tape for knee swelling  added Y tape for medial glide N (skin breakdown)        Assessment:  Noted 4 areas of skin breakdown and redness.  No drainage noted and areas were scabbed over. Noted slight weakness with eccentric lowering.      Short Term Goals: 4 Weeks               - Pt will improve his L knee PROM to 0-90 degrees. GOAL MET, 07/04/2022               - Pt will improve his L leg strength to 3/5 in all muscle groups. GOAL MET, 06/12/2022               - Pt will walk with a cane and be able to ascend/descend steps 1 step at a time.  GOAL MET, 06/12/2022               - Pt will improve his PSFS to 5/10. NOT MET, 06/12/2022     Long Term Goals: 8 Weeks  - Pt will improve his L knee AROM to 0-110 degrees. PROGRESSING, 07/04/2022  - Pt will have no quad lag with SLR x 10.   PROGRESSING, 06/12/2022                  - Pt will improve his L leg strength to 4/5 in all muscle groups.  PROGRESSING, 06/12/2022               - Pt will walk without an assistive device and ascend/descend a flight of regular steps reciprocally in the clinic without use of the rails. PROGRESSING, 07/04/2022               - Pt will improve his PSFS to 7/10. NOT MET, 06/12/2022     Plan: Will continue and progress as tolerated.  Pt is doing really well.  Planning to continue for 2 weeks and then determine if he will need more therapy at that time.  PT will plan to see him 07/18/2022 for a progress  note/discharge summary at that time.            Total Session Time 30 and Timed code minutes 30  THERAPEUTIC EXERCISE 30 minutes      Carlton Adam, PTA  07/11/2022, 07:54

## 2022-07-15 ENCOUNTER — Ambulatory Visit (HOSPITAL_COMMUNITY): Payer: Self-pay

## 2022-07-16 ENCOUNTER — Ambulatory Visit (HOSPITAL_COMMUNITY)
Admission: RE | Admit: 2022-07-16 | Discharge: 2022-07-16 | Disposition: A | Discharge status: Still In Hospital | Payer: 59 | Source: Ambulatory Visit

## 2022-07-16 ENCOUNTER — Other Ambulatory Visit: Payer: Self-pay

## 2022-07-16 NOTE — PT Treatment (Signed)
[facility] Therapy  [address]  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 Pt continues to get sharp pains intermittently on the superior medial side of the patella.  Pt continues to swell on the left side of his knee.  He feels the knee is really tight.  Pt starts hurting more around 2 PM and the knee feels more painful, weak, and swollen at that time of day and for the remainder of the day.  States that he is now able to put his socks/shoes on. Kinesio tape did help the sharp pain but the Kinesio tape irritated his skin.    Suggested that Katherine speak to the doctor if a patella-femoral brace might help.       Patient-Specific Functional Score:  Problem Score at eval 06/12/2022 07/16/2022   1. Go back to work  0 0 0   2. Walk without a device 0 0 10   3. Driving  0 0 10   Average 0 0 6.33         Objective: Warm up on Nustep followed by activities as noted below.       Measured ROM:   In supine AROM L = 0-100 degrees     Girth   At eval 05/24/2022 07/16/2022    left right left Right   5 cm above 59.5 cm 50 cm 56 cm 50 cm   Mid patella 56 cm 52 cm 54 cm 47 cm   5 cm below 51 cm 44.5 cm 47 cm 44.5 cm     NOTE  Pain with hip add in side lying with 3# weight on leg at the site he is getting sharp pain - superior medial pole of the patella      EXERCISE/ACTIVITY NAME REPETITIONS RESISTANCE COMPLETED THIS DOS   Reviewed HEP    X 10 (10 min)    no   NuStep 8 min L5 yes   Bike  5 min   no   KTC with therapy bal     x 10   no   Quad sets    2 x 10   n; HEP   Glut sets    2 x 10   n; HEP   Patellar mobs    R sidelying focused on medial glide/ITB/ Retinaculum stretch no; HEP   SAQ     x 15 3# yes; HEP   LAQ X 15  3# yes   SLR with quad set   no; HEP   Hip strengthening on mat table   - supine SLR  - side lying hip abd  - side lying hip add  - prone hip extension  - prone hamstring curls  X 15 of each  3# each yes   Calf stretches Standing on wedge 30 sec x  3  yes   Manual therapy  (PT noted a lot of swelling - thoughts are that the swelling is pushing the patella medially and causing the pain/catch - Lymphadema massage to improve movement pattern)  Lymphedema massage to get fluid off the lateral side of the knee;  15 min  time includes scar massage also no   Ice and elevation    10-15 min   yes   Seated heel slide X10    yes   Forward/retro walk in bars; exaggerated gait  6 trips    no   Seated HS curl  10 x 5 sec hold  green  no   Stair stretch flexion and extension X 5 each with  10 sec hold   no   Step ups: fwd and lateral  step downs X15  x15 8 inch step   inch step No    Leg press: DL   Single leg press 2x15  2x15 120#  60# no  no   CP post treatment X10 min   no   KT  Patellar glide (medial)  (on 07/04/2022 - kept "U" tape tape under patella from last tape - doffed medial piece, and added lymphatic tape for knee swelling  added Y tape for medial glide N (skin breakdown)        Assessment:  PROGRESS NOTE - Pt has attended 18/24 visits.  Pt has made good progress.  Pt has met 4/4 STGs and 1/5 LTGs.  Pt continues to demonstrate deficits in ROM, pain with certain movements, and weakness that are preventing pt from reaching functional goals.  Pt would continue to benefit from skilled outpatient physical therapy services.         Short Term Goals: 4 Weeks                - Pt will improve his L knee PROM to 0-90 degrees. GOAL MET, 07/04/2022               - Pt will improve his L leg strength to 3/5 in all muscle groups. GOAL MET, 06/12/2022               - Pt will walk with a cane and be able to ascend/descend steps 1 step at a time.  GOAL MET, 06/12/2022               - Pt will improve his PSFS to 5/10. GOAL MET, 07/16/2022     Long Term Goals: 8 Weeks  - Pt will improve his L knee AROM to 0-110 degrees. PROGRESSING, 07/16/2022  - Pt will have no quad lag with SLR x 10.   PROGRESSING, 07/16/2022                 - Pt will improve his L leg strength to 4/5 in all muscle groups.  GOAL MET, 07/16/2022               - Pt will walk without an assistive device and ascend/descend a flight of regular steps reciprocally in the clinic without use of the rails. PROGRESSING, 07/16/2022               - Pt will improve his PSFS to 7/10. PROGRESSING, 07/16/2022     Plan: Pt is progressing well.  He now has developed a sharp pain in his knee which appear to be due to patella femoral tracking.  Recommend continuing with outpatient therapy per doctor's recommendations for 2-3x/week for  4 weeks.        Total Session Time 45, Timed code minutes 30, and Untimed code minutes 15  THERAPEUTIC EXERCISE 30 minutes  Ice at end (not billed)       Darrol Jump  07/16/2022, 08:04    Start of Service:     Re-certification:           From:    Through:       Treatment Diagnosis:           I have reviewed this plan of treatment and re-certify a continuing need for service.      Referring Provider Signature:       Date:        Printed Name of Referring Provider: __________________________________________

## 2022-07-18 ENCOUNTER — Other Ambulatory Visit: Payer: Self-pay

## 2022-07-18 ENCOUNTER — Ambulatory Visit
Admission: RE | Admit: 2022-07-18 | Discharge: 2022-07-18 | Disposition: A | Discharge status: Still In Hospital | Payer: 59 | Source: Ambulatory Visit | Attending: Orthopaedic Surgery | Admitting: Orthopaedic Surgery

## 2022-07-18 NOTE — PT Treatment (Addendum)
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 He states pain wise he has not seen any improvement. States his MD wants him at 110 degrees flexion to help him with going up/down steps. He states he has no trouble with steps, but will continue working on his range.         Objective: Warm up on Nustep followed by activities as noted below.      Instructed patient call his MD about the patella femoral brace d/t him forgetting to do it at his last MD appointment per PT recommendation  Measured ROM:   In supine AROM L = 0-105 degrees      Girth           At eval 05/24/2022 07/16/2022     left right left Right   5 cm above 59.5 cm 50 cm 56 cm 50 cm   Mid patella 56 cm 52 cm 54 cm 47 cm   5 cm below 51 cm 44.5 cm 47 cm 44.5 cm        EXERCISE/ACTIVITY NAME REPETITIONS RESISTANCE COMPLETED THIS DOS   Reviewed HEP    X 10 (10 min)    no   NuStep 72min L8 yes   Bike  5 min   no   KTC with therapy bal     x 10   no   Quad sets    2 x 10   n; HEP   Glut sets    2 x 10   n; HEP   Patellar mobs    R sidelying focused on medial glide/ITB/ Retinaculum stretch no; HEP   SAQ     x 15 3# y; HEP   LAQ X 15  3# yes   SLR with quad set     no; HEP   Hip  strengthening on mat table   - supine SLR  - side lying hip abd  - side lying hip add  - prone hip extension  - prone hamstring curls  X 15 of each  3# each yes   Calf stretches Standing on wedge 30 sec x 3   yes   Manual therapy  (PT noted a lot of swelling - thoughts are that the swelling is pushing the patella medially and causing the pain/catch - Lymphadema massage to improve movement pattern)  Lymphedema massage to get fluid off the lateral side of the knee;  15 min  time includes scar massage also no   Ice and elevation    10 min   yes   Seated heel slide X10    no   Forward/retro walk in bars; exaggerated gait  6 trips  no   Seated HS curl  10 x 5 sec hold  green  no   Stair stretch flexion and extension X 5 each with  10 sec hold   no   Step ups: fwd and lateral  step downs X15  x15 8 inch step   inch step No    Leg press: DL   Single leg press 4P80  2x15 120#  60# no  no   CP post treatment X10 min   no   KT  Patellar glide (medial)  (on 07/04/2022 - kept "U" tape tape under patella from last tape - doffed medial piece, and added lymphatic tape for knee swelling  added Y tape for medial glide N (skin breakdown)    PROM    y     Patient-Specific Functional Score:  Problem Score at eval 06/12/2022 07/16/2022   1. Go back to work  0 0 0   2. Walk without a device 0 0 10   3. Driving  0 0 10   Average 0 0 6.33     Assessment:  Pt continues to demonstrate deficits in knee mobility and weakness that are preventing. Noted hip weakness. Patient was able to tolerated PROM with less pain compared to prior visits and improved from 100 degrees flexion to 105 degrees.     Short Term Goals: 4 Weeks               - Pt will improve his L knee PROM to 0-90 degrees. GOAL MET, 07/04/2022               - Pt will improve his L leg strength to 3/5 in all muscle groups. GOAL MET, 06/12/2022               - Pt will walk with a cane and be able to ascend/descend steps 1 step at a time.  GOAL MET, 06/12/2022               - Pt will  improve his PSFS to 5/10. GOAL MET, 07/16/2022     Long Term Goals: 8 Weeks  - Pt will improve his L knee AROM to 0-110 degrees. PROGRESSING, 07/16/2022  - Pt will have no quad lag with SLR x 10.   PROGRESSING, 07/16/2022                 - Pt will improve his L leg strength to 4/5 in all muscle groups.  GOAL MET, 07/16/2022               - Pt will walk without an assistive device and ascend/descend a flight of regular steps reciprocally in the clinic without use of the rails. PROGRESSING, 07/16/2022               - Pt will improve his PSFS to 7/10. PROGRESSING, 07/16/2022     Plan: Continue strengthening and mobility therex. Improve knee ROM.       Total Session Time 45, Timed code minutes 35, and Untimed code minutes 10  THERAPEUTIC EXERCISE 35 minutes      Trilby Leaver, PTA  07/18/2022, 07:49     PATIENT CALLED AND CANCELLED LAST 2 THERAPY APPOINTMENTS DUE TO ILLNESS AND REQUESTED D/C.  PATIENT ATTENDED 19 PT VISITS BETWEEN 05/24/22 AND 07/18/22 S/P TKA. THERAPY INCLUDED PATIENT EDUCATION, EXERCISES FOR STRENGTHENING AND MOBILITY AS WELL AS MANUAL THERAPY AND KINESIOTAPE. ABOVE IS FINAL DOS. PATIENT IS D/C PER PATIENT REQUEST. HAS BEEN  GIVEN HEP. REHAB GOALS MET IN PART.  Garrel Ridgel, PT  08/01/2022 10:51

## 2022-07-24 ENCOUNTER — Ambulatory Visit (HOSPITAL_COMMUNITY): Payer: Self-pay

## 2022-07-26 ENCOUNTER — Ambulatory Visit (HOSPITAL_COMMUNITY): Payer: Self-pay

## 2022-07-30 ENCOUNTER — Ambulatory Visit (HOSPITAL_COMMUNITY): Payer: Self-pay

## 2022-08-01 ENCOUNTER — Ambulatory Visit (HOSPITAL_COMMUNITY): Payer: Self-pay

## 2023-02-10 IMAGING — US CAROTID US W/DOPPLER
1 series · 14 of 24 positions shown · non-contrast
Comparison: None available.

﻿EXAM:  CAROTID US W/DOPPLER
INDICATION: 67-year-old male to evaluate for carotid stenosis.

[Series 1: carotid us w/doppler · 14 of 74 slices shown]
[im 1/74]
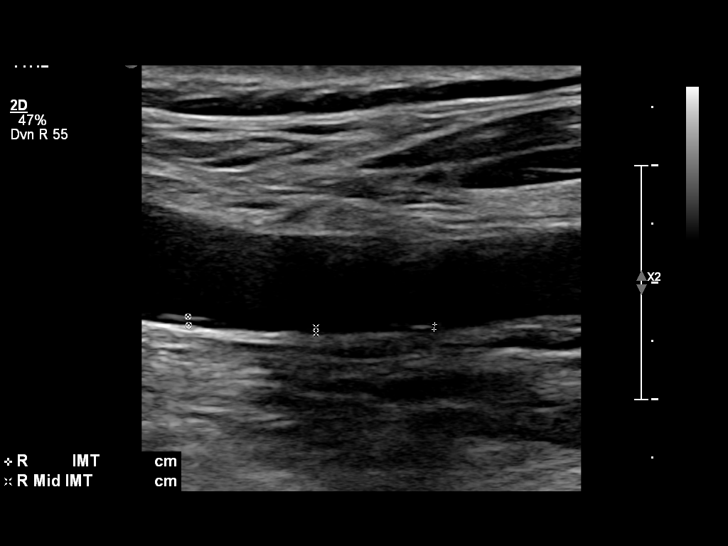
[im 7/74]
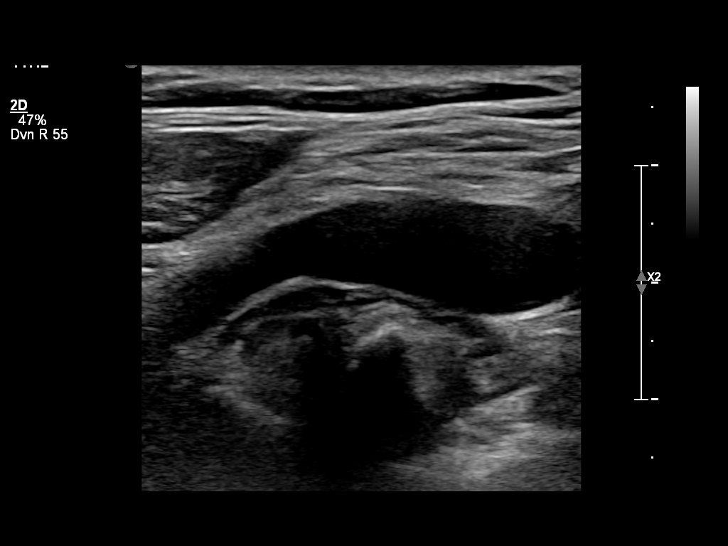
[im 13/74]
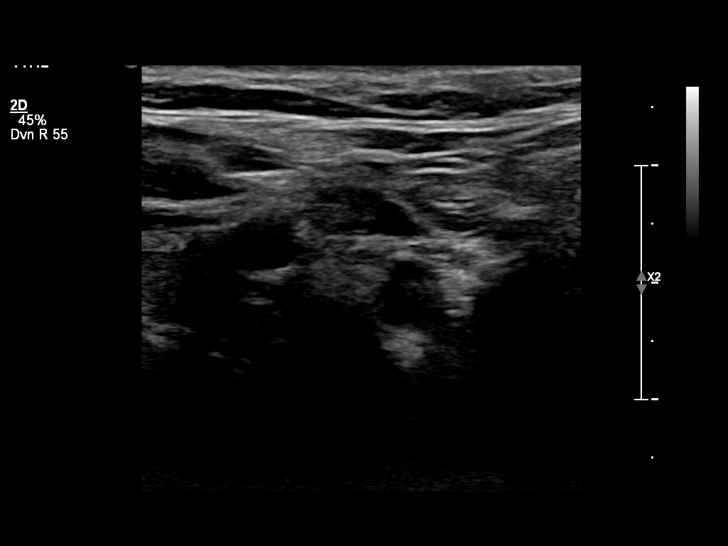
[im 20/74]
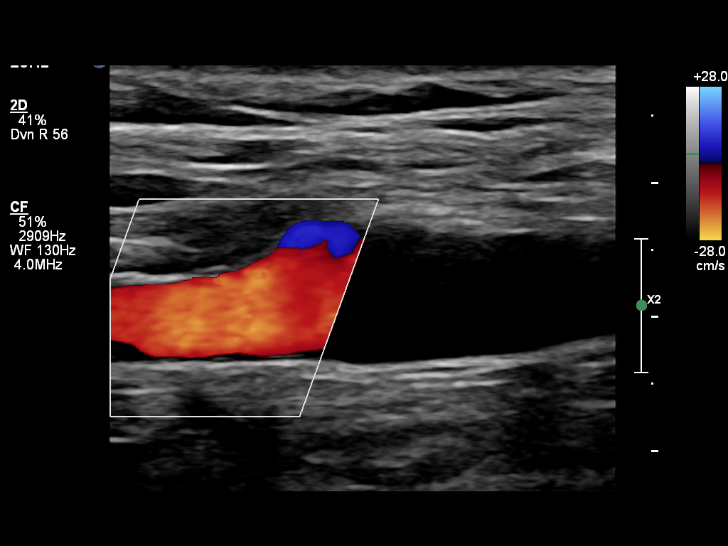
[im 23/74]
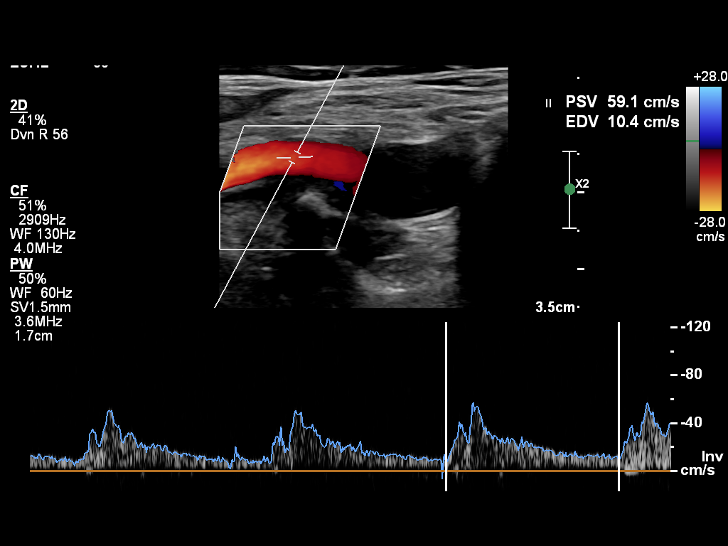
[im 29/74]
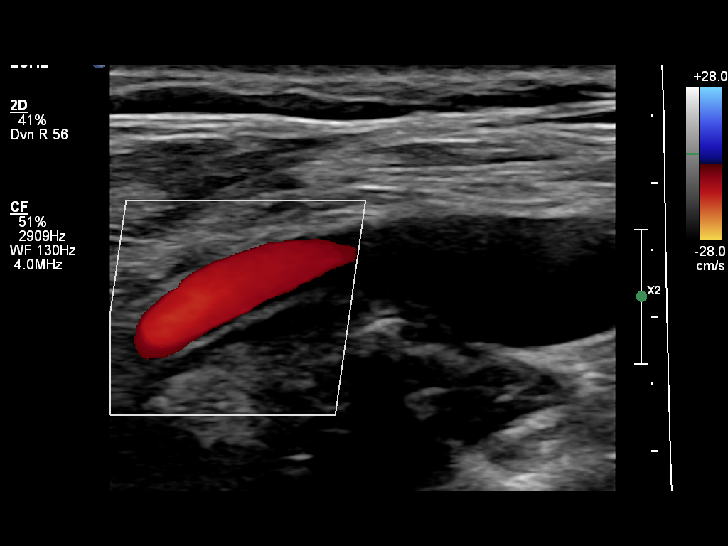
[im 35/74]
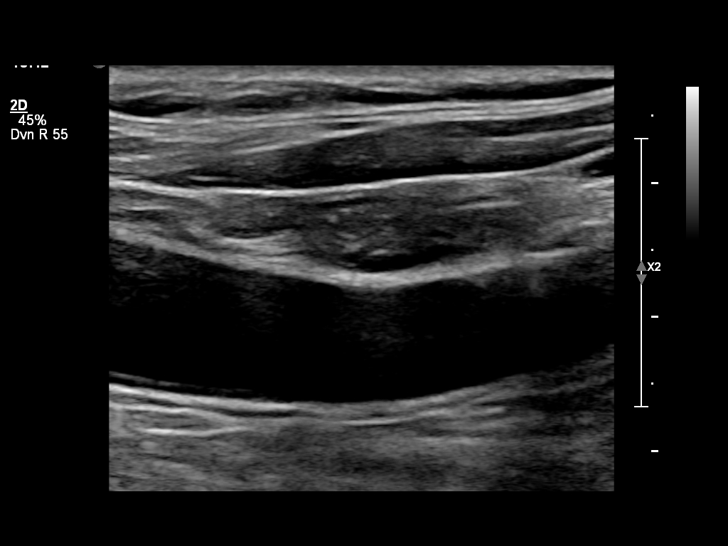
[im 39/74]
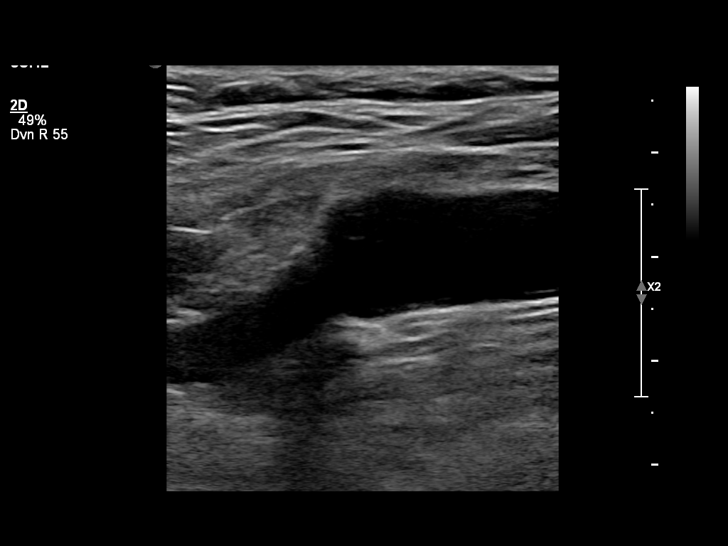
[im 45/74]
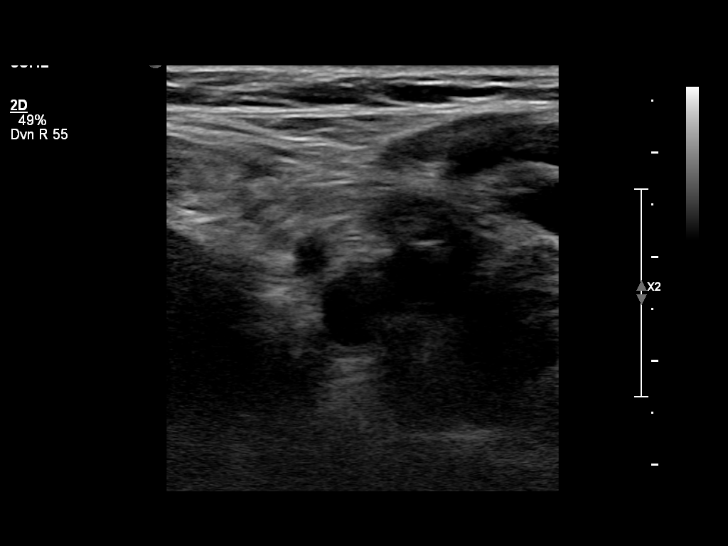
[im 51/74]
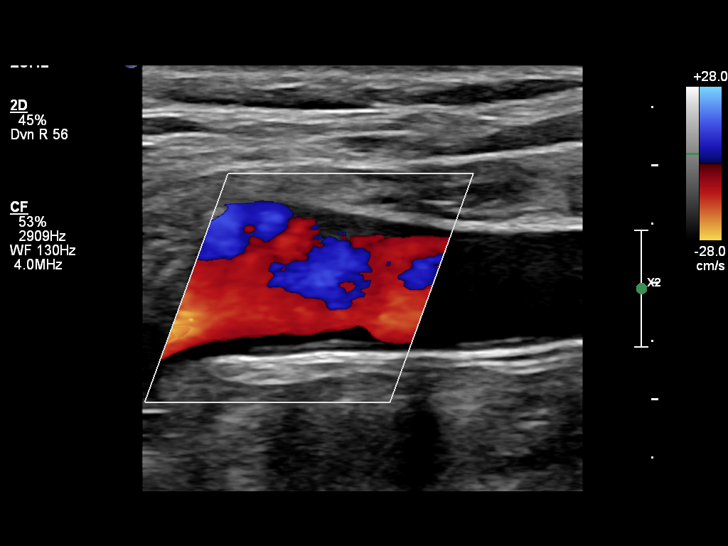
[im 58/74]
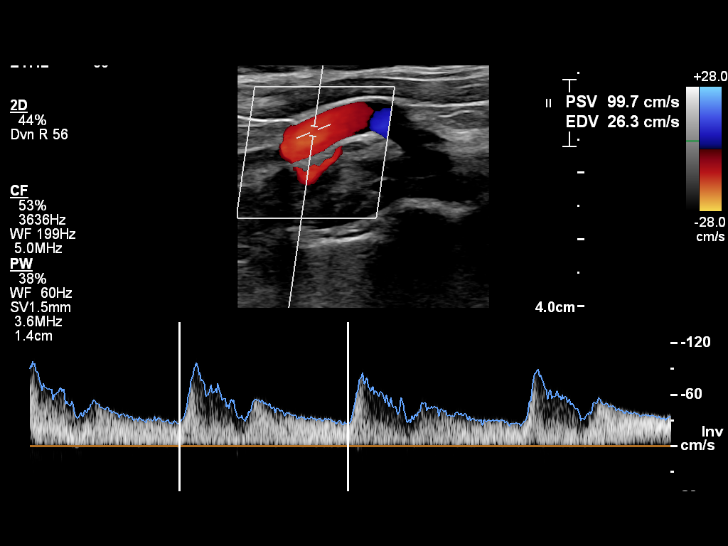
[im 61/74]
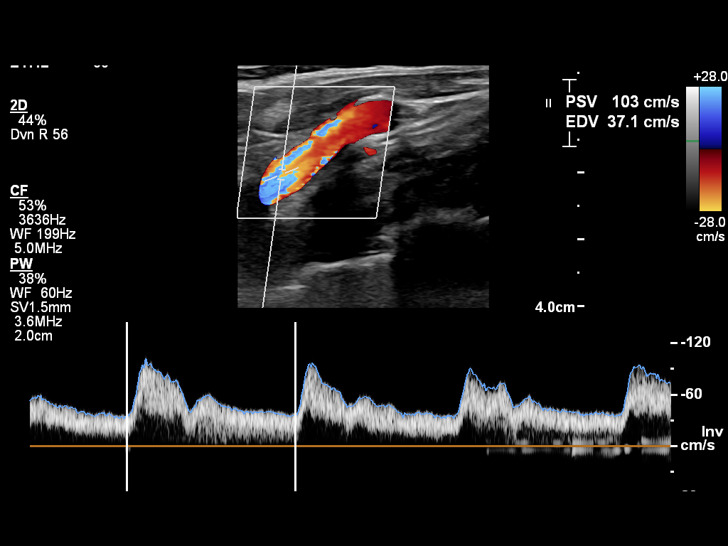
[im 67/74]
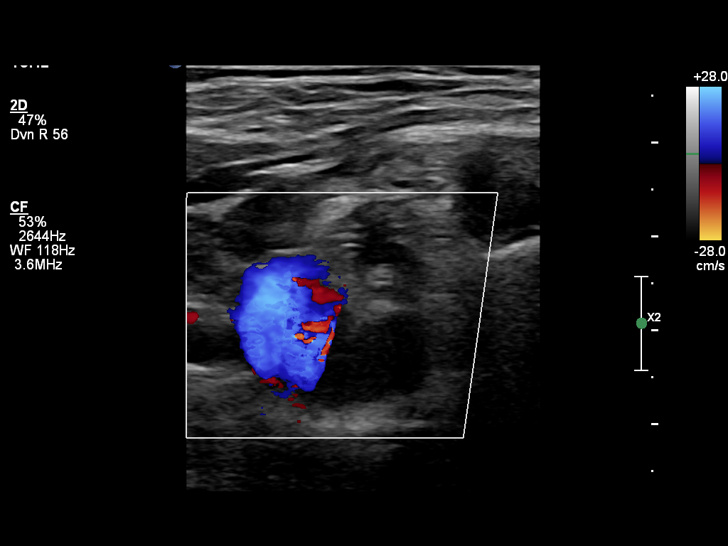
[im 74/74]
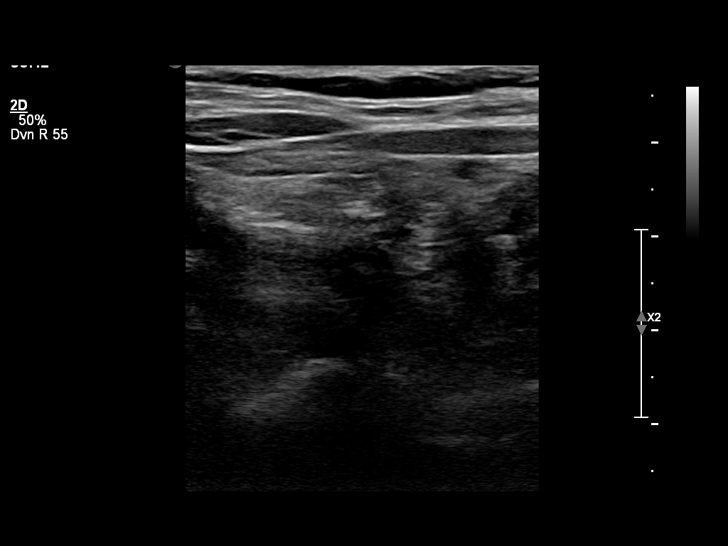

[14 of 24 positions shown; findings below may reference images not displayed]

FINDINGS: Right CCA

97 

Left CCA

111 

PSV

IC/CC

EDV

Right Internal

95

0.97

31

Left Internal

103

0.93

37

Right Vertebral

Antegrade

Left Vertebral

Antegrade

Antegrade flow is noted in both vertebral arteries. Mild atherosclerotic disease producing significantly less than 50% diameter narrowing of proximal internal carotid arteries on both sides.  No evidence of hemodynamically significant obstruction is seen on either side.
IMPRESSION: No hemodynamically significant stenosis within the internal carotid arteries as per NASCET criteria.

## 2023-09-16 ENCOUNTER — Encounter (HOSPITAL_COMMUNITY): Payer: Self-pay | Admitting: Emergency Medicine

## 2023-09-16 ENCOUNTER — Other Ambulatory Visit: Payer: Self-pay

## 2023-09-16 ENCOUNTER — Emergency Department
Admission: EM | Admit: 2023-09-16 | Discharge: 2023-09-16 | Disposition: A | Discharge status: Home / Self Care | Attending: Emergency Medicine | Admitting: Emergency Medicine

## 2023-09-16 ENCOUNTER — Emergency Department (HOSPITAL_COMMUNITY)

## 2023-09-16 DIAGNOSIS — R948 Abnormal results of function studies of other organs and systems: Secondary | ICD-10-CM | POA: Insufficient documentation

## 2023-09-16 DIAGNOSIS — R935 Abnormal findings on diagnostic imaging of other abdominal regions, including retroperitoneum: Secondary | ICD-10-CM

## 2023-09-16 DIAGNOSIS — R319 Hematuria, unspecified: Secondary | ICD-10-CM | POA: Insufficient documentation

## 2023-09-16 DIAGNOSIS — N3289 Other specified disorders of bladder: Secondary | ICD-10-CM | POA: Insufficient documentation

## 2023-09-16 LAB — URINALYSIS, MACROSCOPIC
BILIRUBIN: NEGATIVE mg/dL
BLOOD: 1 mg/dL — AB
GLUCOSE: NEGATIVE mg/dL
KETONES: NEGATIVE mg/dL
LEUKOCYTES: 75 WBCs/uL — AB
NITRITE: NEGATIVE
PH: 6.5 (ref 5.0–9.0)
PROTEIN: 100 mg/dL — AB
SPECIFIC GRAVITY: 1.007 (ref 1.002–1.030)
UROBILINOGEN: NORMAL mg/dL

## 2023-09-16 LAB — CBC WITH DIFF
BASOPHIL #: 0.1 10*3/uL (ref 0.00–0.10)
BASOPHIL %: 1 % (ref 0–1)
EOSINOPHIL #: 0.2 10*3/uL (ref 0.00–0.50)
EOSINOPHIL %: 4 % (ref 1–8)
HCT: 44.9 % (ref 36.7–47.1)
HGB: 15.2 g/dL (ref 12.5–16.3)
LYMPHOCYTE #: 1.3 10*3/uL (ref 1.00–3.20)
LYMPHOCYTE %: 19 % (ref 15–43)
MCH: 31 pg (ref 23.8–33.4)
MCHC: 33.9 g/dL (ref 32.5–36.3)
MCV: 91.6 fL (ref 73.0–96.2)
MONOCYTE #: 0.5 10*3/uL (ref 0.30–1.10)
MONOCYTE %: 8 % (ref 6–14)
MPV: 8.1 fL (ref 7.4–11.4)
NEUTROPHIL #: 4.7 10*3/uL (ref 1.70–7.60)
NEUTROPHIL %: 68 % (ref 44–74)
PLATELETS: 186 10*3/uL (ref 140–440)
RBC: 4.9 10*6/uL (ref 4.06–5.63)
RDW: 15.2 % (ref 12.1–16.2)
WBC: 6.9 10*3/uL (ref 3.6–10.2)

## 2023-09-16 LAB — URINALYSIS, MICROSCOPIC: RBCS: 6484 /HPF — ABNORMAL HIGH (ref <4)

## 2023-09-16 LAB — COMPREHENSIVE METABOLIC PANEL, NON-FASTING
ALBUMIN/GLOBULIN RATIO: 1.8 — ABNORMAL HIGH (ref 0.8–1.4)
ALBUMIN: 4.2 g/dL (ref 3.5–5.7)
ALKALINE PHOSPHATASE: 67 U/L (ref 34–104)
ALT (SGPT): 19 U/L (ref 7–52)
ANION GAP: 7 mmol/L (ref 4–13)
AST (SGOT): 16 U/L (ref 13–39)
BILIRUBIN TOTAL: 0.6 mg/dL (ref 0.3–1.0)
BUN/CREA RATIO: 17 (ref 6–22)
BUN: 21 mg/dL (ref 7–25)
CALCIUM, CORRECTED: 9.1 mg/dL (ref 8.9–10.8)
CALCIUM: 9.3 mg/dL (ref 8.6–10.3)
CHLORIDE: 103 mmol/L (ref 98–107)
CO2 TOTAL: 28 mmol/L (ref 21–31)
CREATININE: 1.23 mg/dL (ref 0.60–1.30)
ESTIMATED GFR: 65 mL/min/{1.73_m2} (ref >59)
GLOBULIN: 2.4 (ref 2.0–3.5)
GLUCOSE: 128 mg/dL — ABNORMAL HIGH (ref 74–109)
OSMOLALITY, CALCULATED: 280 mosm/kg (ref 270–290)
POTASSIUM: 4 mmol/L (ref 3.5–5.1)
PROTEIN TOTAL: 6.6 g/dL (ref 6.4–8.9)
SODIUM: 138 mmol/L (ref 136–145)

## 2023-09-16 MED ORDER — CEFTRIAXONE 1 GRAM SOLUTION FOR INJECTION
INTRAMUSCULAR | Status: AC
Start: 2023-09-16 — End: 2023-09-16
  Filled 2023-09-16: qty 10

## 2023-09-16 MED ORDER — CEFPODOXIME 200 MG TABLET
200.0000 mg | ORAL_TABLET | Freq: Two times a day (BID) | ORAL | 0 refills | Status: AC
Start: 2023-09-16 — End: 2023-09-30

## 2023-09-16 MED ORDER — LIDOCAINE HCL 10 MG/ML (1 %) INJECTION SOLUTION
INTRAMUSCULAR | Status: AC
Start: 2023-09-16 — End: 2023-09-16
  Filled 2023-09-16: qty 50

## 2023-09-16 MED ORDER — LIDOCAINE HCL 10 MG/ML (1 %) INJECTION SOLUTION
1.0000 g | INTRAMUSCULAR | Status: AC
Start: 2023-09-16 — End: 2023-09-16
  Administered 2023-09-16: 1 g via INTRAMUSCULAR

## 2023-09-16 NOTE — ED Nurses Note (Signed)
 Pt. To CT

## 2023-09-16 NOTE — ED Triage Notes (Signed)
 Peeing blood, bright red blood. States that has had similar episodes the last couple months but states today it seem to be a larger amount of bleeding. Uncomfortable with burning with pressure to the lower abd.

## 2023-09-16 NOTE — ED Nurses Note (Signed)
 Pt. Arrived via triage with c/o hematuria that has worsened over the past two days. Pt. Reports dysuria, burning on urination and inability to fully empty bladder with pressure and intermittent bilateral side/flank pain. Pt. Reports moderate blood on urination. Pt. Denies fever, N/V. VSS, NADN.

## 2023-09-16 NOTE — ED Nurses Note (Signed)
 Pt. Provided with D/C instructions at this time. No further questions or concerns. NADN.

## 2023-09-16 NOTE — ED Provider Notes (Signed)
 [facility]  ED Primary Provider Note  Patient Name: Steven Juarez  Patient Age: 68 y.o.  Date of Birth: 09/26/1955    Chief Complaint: Blood in urine        History of Present Illness       KRISTY CATOE is a 68 y.o. male who had concerns including Blood in urine.  This patient presents through triage this is a 68 year old with reported gross hematuria.  These orders could not be located nor verified.  The patient states he lives in with [place], and his provider sent orders to have imaging done at this hospital.  The patient states he is having some suprapubic related pressure, and the hematuria resolved prior to my evaluation.        Review of Systems     No other overt Review of Systems are noted to be positive except noted in the HPI.      Historical Data   History Reviewed This Encounter: Medical History  Surgical History  Family History  Social History      Physical Exam   ED Triage Vitals [09/16/23 1102]   BP (Non-Invasive) (!) 147/86   Heart Rate 87   Respiratory Rate 20   Temperature 36.9 C (98.4 F)   SpO2 98 %   Weight 120 kg (265 lb)   Height 1.88 m (6\' 2" )         Nursing notes reviewed for what could be assessed. Past Medical, Surgical, and Social history reviewed for what has been completed.     Constitutional: NAD. Well-Developed. Well Nourished.  Head: Normocephalic, atraumatic.  Mouth/Throat:  Symmetric facial movement.  Eyes: EOM grossly intact, conjunctiva normal.  Neck: Supple  Cardiovascular: Regular Rate and Rhythm, extremities well perfused.  Pulmonary/Chest: No respiratory distress. Lungs are symmetric to auscultation bilaterally.  Abdominal: Soft, non-tender, non-distended. Non peritoneal, no rebound, no guarding.  MSK: No Lower Extremity Edema.  Skin: Warm, dry.  Neuro: Appropriate, CN II-XII grossly intact. Gait not ataxic.  Psych: Pleasant during the emergency encounter.            Procedures      Patient Data     Labs Ordered/Reviewed    URINALYSIS, MACROSCOPIC - Abnormal; Notable for the following components:       Result Value    COLOR Red (*)     APPEARANCE Ex. Turbid (*)     LEUKOCYTES 75 (*)     PROTEIN 100 (*)     BLOOD 1.0 (*)     All other components within normal limits    Narrative:     Substances that cause abnormal urine color may affect the readability of the urinalysis reagent strips. The color development on the reagant pad may be masked and could be interpreted as a false positive.     URINALYSIS, MICROSCOPIC - Abnormal; Notable for the following components:    RBCS 6,484 (*)     All other components within normal limits   COMPREHENSIVE METABOLIC PANEL, NON-FASTING - Abnormal; Notable for the following components:    GLUCOSE 128 (*)     ALBUMIN/GLOBULIN RATIO 1.8 (*)     All other components within normal limits    Narrative:     Estimated Glomerular Filtration Rate (eGFR) is calculated using the CKD-EPI (2021) equation, intended for patients 90 years of age and older. If gender is not documented or "unknown", there will be no eGFR calculation.     CBC WITH DIFF -  Normal   URINE CULTURE,ROUTINE   URINALYSIS, MACROSCOPIC AND MICROSCOPIC W/CULTURE REFLEX    Narrative:     The following orders were created for panel order URINALYSIS, MACROSCOPIC AND MICROSCOPIC W/CULTURE REFLEX [PRN ONLY].  Procedure                               Abnormality         Status                     ---------                               -----------         ------                     URINALYSIS, MACROSCOPIC[[id number]]      Abnormal            Final result               URINALYSIS, MICROSCOPIC[[id number]]      Abnormal            Final result                 Please view results for these tests on the individual orders.   CBC/DIFF    Narrative:     The following orders were created for panel order CBC/DIFF.  Procedure                               Abnormality         Status                     ---------                               -----------         ------                      CBC WITH ZOXW[[id number]]                Normal              Final result                 Please view results for these tests on the individual orders.       CT ABDOMEN PELVIS WO IV CONTRAST   Final Result by Edi, Radresults In (03/11 1316)   1. MILD BLADDER WALL THICKENING WITH SUBTLE SOFT TISSUE STRANDING ALONG THE ANTERIOR MARGIN OF THE BLADDER. THIS COULD BE CORRELATED WITH URINALYSIS   2. NO STONES OR HYDRONEPHROSIS             One or more dose reduction techniques were used (e.g., Automated exposure control, adjustment of the mA and/or kV according to patient size, use of iterative reconstruction technique).         Radiologist location ID: [id number]             Medical Decision Making          Medical Decision Making          Studies Assessed:  Lab, radiology    MDM Narrative:  This patient  is a 68 year old male who presents with gross hematuria.  Differential includes urinary tract infection, ureterolithiasis, cancer.  The patient is reportedly anxious as described by the wife.  He states that his primary care doctor sent orders for him to have testing done at this hospital however these can not be located.  The patient was evaluated with his urinalysis showing hematuria.  A CT scan was obtained given the suprapubic discomfort, showing some slight bladder abnormality.  Given the gross hematuria, I discussed the possibility of needing further urologic evaluation and gave the patient a referral to Urology.  Nursing further instructed the patient to make an appointment to see a Urologist.  In treating the possibility/differential of an infectious finding, he was given intramuscular Rocephin, in addition to cefpodoxime at discharge.    I received notification after the patient left the emergency department reporting the patient arrived to the Urology office and demanded to be seen at that moment.  The patient reported to Urology stating the emergency department told him to be seen.  After  confirmation with nursing, the patient did not receive this type of instruction by any team member.  We did follow up with Dr. Sharlene Motts, Urology in addition to the office manager Lanora Manis.  The patient does not appear to require emergent cystoscopy or urology evaluation, and could be seen in the clinic.  He is hemodynamically stable.  Considering the patient was very assertive/aggressive to the urology staff in the clinic, the emergency department charge nurse did call and offer to flag the patient's chart has a combative patient if they felt the presentation and actions rose to the level of this classification.        Differential includes but not limited to:  Cystitis: Considered and treated  Malignancy: Considered and referral placed  Other location of urinary tract infection: Considered and treated      Follow-Up Discussion:  Patient updated    Please see documentation above for specific labs and radiology.    Decision for and Complexity of Risk in the ED encounter:  I ordered prescription medications requiring authorization in the ED or at discharge.  Social Determinants Contributing to Healthcare Difficulty:  The patient reported his doctor sending orders to the hospital for testing which were not found, resulting in ED evaluation.  We were unable to verify his previous provider's orders.    Independent/Additional Historian:  The patient's wife with information given about the patient.                                    Medications Ordered/Administered in the ED   cefTRIAXone (ROCEPHIN) 1 g in lidocaine 2.86 mL (tot vol) IM injection (1 g IntraMUSCULAR Given 09/16/23 1401)       Following the history, physical exam, and ED workup, the patient was deemed stable and suitable for discharge. The patient/caregiver was advised to return to the ED for any new or worsening symptoms. Discharge medications, and follow-up instructions were discussed with the patient/caregiver in detail, who verbalizes understanding. The  patient/caregiver is in agreement and is comfortable with the plan of care.    Disposition: Discharged         Current Discharge Medication List        START taking these medications.        Details   cefpodoxime 200 mg Tablet  Commonly known as: VANTIN   200 mg, Oral, 2 TIMES  DAILY  Qty: 28 Tablet  Refills: 0            CONTINUE these medications - NO CHANGES were made during your visit.        Details   Amlodipine-Benazepril 10-20 mg Capsule   1 Capsule, Oral, DAILY  Refills: 0     * aspirin 81 mg Tablet, Chewable   81 mg, Oral, DAILY  Refills: 0     * aspirin 81 mg Tablet, Delayed Release (E.C.)  Commonly known as: ECOTRIN   81 mg, Oral, 2 TIMES DAILY, X 6 weeks  Refills: 0     bethanechol chloride 5 mg Tablet  Commonly known as: URECHOLINE   5 mg, DAILY  Refills: 0     FLAXSEED OIL ORAL   1 Tablet, Oral, DAILY  Refills: 0     gabapentin 300 mg Capsule  Commonly known as: NEURONTIN   600 mg, Oral, 2 TIMES DAILY  Refills: 0     HYDROcodone-acetaminophen 7.5-325 mg Tablet  Commonly known as: NORCO   1 Tablet, Oral, EVERY 6 HOURS PRN  Qty: 35 Tablet  Refills: 0     levothyroxine 150 mcg Tablet  Commonly known as: SYNTHROID   150 mcg, Oral, EVERY MORNING  Refills: 0     pantoprazole 40 mg Tablet, Delayed Release (E.C.)  Commonly known as: PROTONIX   40 mg, Oral, DAILY  Refills: 0     rosuvastatin 10 mg Tablet  Commonly known as: CRESTOR   10 mg, Oral, DAILY  Refills: 0     sertraline 50 mg Tablet  Commonly known as: ZOLOFT   50 mg, Oral, DAILY  Refills: 0     triamterene-hydroCHLOROthiazide 37.5-25 mg Tablet  Commonly known as: MAXZIDE-25   1 Tablet, Oral, DAILY  Refills: 0     vitamin E 400 unit Capsule   400 Units, Oral, DAILY  Refills: 0           * This list has 2 medication(s) that are the same as other medications prescribed for you. Read the directions carefully, and ask your doctor or other care provider to review them with you.                Follow up:   No follow-up provider specified.             "Risk"  as noted in the medical decision-making is in reference to potential morbidity/mortality of management based upon previously established billing guidelines.    Based upon the clinical setting, the likely diagnosis/impression include:    Clinical Impression   Hematuria, unspecified type (Primary)   Abnormal CT of the abdomen         Discharge Medication List as of 09/16/2023  1:27 PM        START taking these medications    Details   cefpodoxime (VANTIN) 200 mg Oral Tablet Take 1 Tablet (200 mg total) by mouth Twice daily for 14 days, Disp-28 Tablet, R-0, E-Rx                   This note was partially created using voice recognition software and is inherently subject to errors including those of syntax and "sound alike " substitutions which may escape proof reading. In such instances, original meaning may be extrapolated by contextual derivation.      /R. Tobey Bride, MD, Lacie Scotts  Department of Emergency Medicine  [facility]

## 2023-09-16 NOTE — Discharge Instructions (Addendum)

## 2023-09-18 LAB — URINE CULTURE,ROUTINE: URINE CULTURE: NO GROWTH

## 2023-09-24 ENCOUNTER — Other Ambulatory Visit: Attending: Physician Assistant | Admitting: Physician Assistant

## 2023-09-24 ENCOUNTER — Encounter (INDEPENDENT_AMBULATORY_CARE_PROVIDER_SITE_OTHER): Payer: Self-pay | Admitting: Physician Assistant

## 2023-09-24 ENCOUNTER — Ambulatory Visit (INDEPENDENT_AMBULATORY_CARE_PROVIDER_SITE_OTHER): Payer: Self-pay | Admitting: Physician Assistant

## 2023-09-24 ENCOUNTER — Other Ambulatory Visit: Payer: Self-pay

## 2023-09-24 DIAGNOSIS — R310 Gross hematuria: Secondary | ICD-10-CM

## 2023-09-24 DIAGNOSIS — Z9889 Other specified postprocedural states: Secondary | ICD-10-CM

## 2023-09-24 DIAGNOSIS — N400 Enlarged prostate without lower urinary tract symptoms: Secondary | ICD-10-CM

## 2023-09-24 DIAGNOSIS — R319 Hematuria, unspecified: Secondary | ICD-10-CM

## 2023-09-24 DIAGNOSIS — R935 Abnormal findings on diagnostic imaging of other abdominal regions, including retroperitoneum: Secondary | ICD-10-CM | POA: Insufficient documentation

## 2023-09-24 DIAGNOSIS — N529 Male erectile dysfunction, unspecified: Secondary | ICD-10-CM

## 2023-09-24 LAB — URINALYSIS, MICROSCOPIC
RBCS: 4 /HPF — ABNORMAL HIGH (ref <4)
WBCS: <1 /HPF (ref <6)

## 2023-09-24 NOTE — Progress Notes (Signed)
 UROLOGY, [facility]  [address] [place] [address]    Progress Note    Name: Steven Juarez MRN:  [id number]   Date: 09/24/2023 DOB:  05/23/1956 (68 y.o.)             Chief Complaint: Hospital Follow Up and Blood in urine  Subjective   Subjective:   Steven Juarez is a pleasant 68 y.o. White male with history of BPH sp TURP approximately 2019 whom presents to the clinic for gross hematuria. Patient with 2 episodes of gross hematuria 06/2023 and 09/16/23.CT abdomen/Pelvis 09/16/23 Kidneys:   Unremarkable. No stones or hydronephrosis.Bladder:  Mild bladder wall thickening with subtle soft tissue stranding along the anterior margin of the bladder. Prostate:  Mild enlargement of the prostate gland. Patient denies personal or family history of urinary malignancy.Patient denies occupational chemical exposure.Patient denies history of tobacco use.Patient denies fevers, chills, nausea, vomiting, dysuria, flank pain, incontinence, dribbling, hesitancy, suprapubic pain, headaches, vision changes, shortness of breath, chest pain.         Objective   Objective :  BP (!) 109/58 (Site: Right Arm, Patient Position: Sitting, Cuff Size: Adult)   Pulse (!) 49   Ht 1.88 m (6\' 2" )   Wt 119 kg (263 lb 6.4 oz)   BMI 33.82 kg/m       Gen: NAD, alert  Pulm: unlabored at rest  CV: palpable pulses  Abd: soft, Nt/ND  GU: no suprapubic tenderness, no CVAT    Data reviewed:    Current Outpatient Medications   Medication Sig    Amlodipine-Benazepril 10-20 mg Oral Capsule Take 1 Capsule by mouth Once a day    aspirin (ECOTRIN) 81 mg Oral Tablet, Delayed Release (E.C.) Take 1 Tablet (81 mg total) by mouth Twice daily X 6 weeks    aspirin 81 mg Oral Tablet, Chewable Chew 1 Tablet (81 mg total) Once a day    bethanechol chloride (URECHOLINE) 5 mg Oral Tablet Take 1 Tablet (5 mg total) by mouth Once a day (Patient not taking: Reported on 05/22/2022)    cefpodoxime (VANTIN) 200 mg Oral Tablet Take 1 Tablet (200 mg  total) by mouth Twice daily for 14 days    FLAXSEED OIL ORAL Take 1 Tablet by mouth Once a day    gabapentin (NEURONTIN) 300 mg Oral Capsule Take 2 Capsules (600 mg total) by mouth Twice daily    HYDROcodone-acetaminophen (NORCO) 7.5-325 mg Oral Tablet Take 1 Tablet by mouth Every 6 hours as needed for Pain    levothyroxine (SYNTHROID) 150 mcg Oral Tablet Take 1 Tablet (150 mcg total) by mouth Every morning    pantoprazole (PROTONIX) 40 mg Oral Tablet, Delayed Release (E.C.) Take 1 Tablet (40 mg total) by mouth Once a day    rosuvastatin (CRESTOR) 10 mg Oral Tablet Take 1 Tablet (10 mg total) by mouth Once a day    sertraline (ZOLOFT) 50 mg Oral Tablet Take 1 Tablet (50 mg total) by mouth Once a day    triamterene-hydroCHLOROthiazide (MAXZIDE-25) 37.5-25 mg Oral Tablet Take 1 Tablet by mouth Once a day    vitamin E 400 unit Oral Capsule Take 1 Capsule (400 Units total) by mouth Once a day          Urine POCT         Assessment/Plan  Problem List Items Addressed This Visit    None  Visit Diagnoses         Hematuria, unspecified type  Relevant Orders    POCT URINE DIPSTICK    URINALYSIS, MICROSCOPIC      Abnormal CT of the abdomen        Relevant Orders    POCT URINE DIPSTICK    URINALYSIS, MICROSCOPIC          Gross Hematuria  I discussed the differential diagnosis, pathophysiology and nature of asymptomatic microscopic hematuria with patient.  We discussed the that patient falls into the high risk category for genitourinary malignancy as defined by the 2020 AUA Guidelines on microscopic hematuria and the need for further evaluation including:  Labs:  Microscopic urinalysis RBC > 6000  Evaluation: CT Urogram and cystoscopy  Risks of cystourethroscopy including infection, discomfort, transient hematuria, as well as benefits and alternatives were discussed, if pertinent, with patient in detail who voiced agreement and understanding.    Thickening of bladder wall with soft tissue stranding in setting of negative  urine culture  Cystoscopy    BPH/LUTS sp TURP 2019  Discussed the differential diagnosis, pathophysiology and nature of benign prostate enlargement causing lower urinary tract symptoms  Counseled patient on conservative management options including appropriate fluid management, avoidance of diuretics including caffeine and alcohol  Additionally, we discussed the role of pharmacotherapy, including risks, benefits and alternatives:  Alpha-blocker therapy [e.g. Tamsulosin] - potential risks of dizziness, asthenia, orthostasis, and retrograde ejaculation  5-Alpha Reductase Inhibitors [e.g. Finasteride] - potential risks of painful breast enlargement, diminished libido or erectile dysfunction, ejaculatory dysfunction, and potential concerns for increased risk of high grade prostate cancer and delayed time improved symptoms      Erectile Dysfunction  I discussed the differential diagnosis, pathophysiology and nature of erectile dysfunction, including contributing risk factors in patient's case  I counseled patient on lifestyle modifications including regular cardiovascular exercise, tight glycemic control, maintenance of a healthy weight, moderate alcohol intake and smoking cessation, if applicable  Each of the various following erectile dysfunction treatment options, including risks, benefits and instructions on administration were provided to patient  Phosphodiesterase-5 inhibitor oral therapy (Sildenafil, Vardenafil, Tadalafil) - discussed potential risks of nasal congestion, headache, vision impairment including blue-green discoloration, and/or undesired therapeutic benefit  Intracavernous injection therapy (Caverject, Edex) & intraurethral suppository (MUSE) - discussed potential risks of priapism, penile scarring with subsequent curvature, dysuria, bleeding, pain and/or undesired therapeutic benefit  Vacuum erection device - discussed potential risks of bruising, skin breakdown, penile pain, anejaculation and  temporary penile numbness  Penile prosthesis surgery - discussed risks of pain, urethral injury, prosthetic infection requiring explantation, device malfunction, and  patient/partner disatisfaction      Kelyn Koskela, PA-C     A combined total of  20 minutes were spent preparing to see the patient, reviewing previous records, ordering tests/medications/procedures, documenting the clinical encounter as well as performing a medically appropriate evaluation and independently interpreting results and communicating them to the patient/family/caregiver as specifically outlined above in the impression and plan.

## 2023-09-25 ENCOUNTER — Ambulatory Visit (INDEPENDENT_AMBULATORY_CARE_PROVIDER_SITE_OTHER): Payer: Self-pay | Admitting: UROLOGY

## 2023-09-25 ENCOUNTER — Other Ambulatory Visit (INDEPENDENT_AMBULATORY_CARE_PROVIDER_SITE_OTHER): Payer: Self-pay | Admitting: UROLOGY

## 2023-09-25 ENCOUNTER — Ambulatory Visit (INDEPENDENT_AMBULATORY_CARE_PROVIDER_SITE_OTHER): Payer: Self-pay | Admitting: Physician Assistant

## 2023-09-25 VITALS — BP 121/77 | HR 64 | Ht 74.0 in | Wt 264.0 lb

## 2023-09-25 DIAGNOSIS — N3289 Other specified disorders of bladder: Secondary | ICD-10-CM

## 2023-09-25 DIAGNOSIS — R319 Hematuria, unspecified: Secondary | ICD-10-CM

## 2023-09-25 DIAGNOSIS — N400 Enlarged prostate without lower urinary tract symptoms: Secondary | ICD-10-CM

## 2023-09-25 MED ORDER — FINASTERIDE 5 MG TABLET
5.0000 mg | ORAL_TABLET | Freq: Every day | ORAL | 3 refills | Status: DC
Start: 2023-09-25 — End: 2023-09-25

## 2023-09-25 NOTE — Procedures (Signed)
 UROLOGY, [facility]  [address] [place] [address]    Procedure Note    Name: Steven Juarez MRN:  [id number]   Date: 09/25/2023 DOB:  1955/09/14 (68 y.o.)     Patient with a history of gross hematuria x2 once a year ago and other month ago.    IMPRESSION:  1.MILD BLADDER WALL THICKENING WITH SUBTLE SOFT TISSUE STRANDING ALONG THE ANTERIOR MARGIN OF THE BLADDER. THIS COULD BE CORRELATED WITH URINALYSIS  2.NO STONES OR HYDRONEPHROSIS    Cysto-Diagnostic    Performed by: Mickie Alba, MD  Authorized by: Mickie Alba, MD    Procedure discussed: discussed risks, benefits and alternatives    Chaperone present: yes    Timeout: timeout called immediately prior to procedure    Prep: patient was prepped and draped in usual sterile fashion    Prep type: Betadine    Anesthesia: local anesthesia      Procedure Details     Cystoscope type: flexible    Cystoscopy route: transurethral      Cystoscopy location: native bladder      Irrigation used: saline      Urethra     Urethra: normal      Prostate     Prostate: abnormal      Appearance: s/p TURP - obstructing tissue present      Ejaculatory duct: normal      Foreign body/implant present: no      Lobe enlargement: trilobar enlargement      Findings: vascular and friable      Bladder neck characteristics: high median bar and prostate median lobe protrusion      Bladder neck passable: yes      Dilation/calibration performed: no      Bladder     Bladder: abnormal      Abnormal bladder findings: trabeculation      Trabeculation: mild      Right ureteral orifice appearance: orthotopic      Left ureteral orifice appearance: orthotopic      Right ureteral orifice efflux appearance: clear      Left ureteral orifice efflux appearance: clear      Post-Procedure Details     Catheter placed: no      Appearance of urine after procedure: pink    Outcome: patient tolerated procedure well with no complications      Disposition: discharged home in satisfactory  condition        Discussed with the patient's treatment options we will start Proscar .  We will PSA prior to starting Proscar  he will wait over week before obtained in the PSA.  Then start Prozac scar that day.     Mickie Alba, MD

## 2023-09-25 NOTE — Telephone Encounter (Signed)
 RX approved and encounter closed

## 2023-09-30 ENCOUNTER — Other Ambulatory Visit (INDEPENDENT_AMBULATORY_CARE_PROVIDER_SITE_OTHER): Payer: Self-pay | Admitting: Physician Assistant

## 2023-10-01 ENCOUNTER — Other Ambulatory Visit (INDEPENDENT_AMBULATORY_CARE_PROVIDER_SITE_OTHER): Payer: Self-pay | Admitting: Physician Assistant

## 2023-10-01 MED ORDER — PREDNISONE 50 MG TABLET
ORAL_TABLET | ORAL | 0 refills | Status: AC
Start: 2023-10-01 — End: ?

## 2023-10-01 MED ORDER — DIPHENHYDRAMINE 50 MG CAPSULE
50.0000 mg | ORAL_CAPSULE | Freq: Once | ORAL | 0 refills | Status: AC
Start: 2023-10-01 — End: 2023-10-01

## 2023-10-15 ENCOUNTER — Other Ambulatory Visit (INDEPENDENT_AMBULATORY_CARE_PROVIDER_SITE_OTHER): Payer: Self-pay | Admitting: Physician Assistant

## 2023-10-15 ENCOUNTER — Encounter (INDEPENDENT_AMBULATORY_CARE_PROVIDER_SITE_OTHER): Payer: Self-pay | Admitting: Physician Assistant

## 2023-10-15 DIAGNOSIS — R319 Hematuria, unspecified: Secondary | ICD-10-CM

## 2023-10-15 DIAGNOSIS — R935 Abnormal findings on diagnostic imaging of other abdominal regions, including retroperitoneum: Secondary | ICD-10-CM

## 2023-10-15 DIAGNOSIS — N400 Enlarged prostate without lower urinary tract symptoms: Secondary | ICD-10-CM

## 2023-10-15 NOTE — Nursing Note (Signed)
 Pt called wanting referral to the [facility] to see one the Urologists there for transfer of care until there is a Urologist that will be here full time, referral sent.

## 2023-10-20 ENCOUNTER — Encounter (INDEPENDENT_AMBULATORY_CARE_PROVIDER_SITE_OTHER): Payer: Self-pay

## 2023-10-27 ENCOUNTER — Ambulatory Visit (HOSPITAL_COMMUNITY): Payer: Self-pay

## 2024-05-14 ENCOUNTER — Other Ambulatory Visit (HOSPITAL_COMMUNITY): Payer: Self-pay

## 2024-05-14 DIAGNOSIS — I10 Essential (primary) hypertension: Secondary | ICD-10-CM

## 2024-07-05 ENCOUNTER — Ambulatory Visit
Admission: RE | Admit: 2024-07-05 | Discharge: 2024-07-05 | Disposition: A | Discharge status: Home / Self Care | Source: Ambulatory Visit

## 2024-07-05 ENCOUNTER — Other Ambulatory Visit: Payer: Self-pay

## 2024-07-05 DIAGNOSIS — I10 Essential (primary) hypertension: Secondary | ICD-10-CM | POA: Insufficient documentation

## 2024-07-15 DIAGNOSIS — I10 Essential (primary) hypertension: Secondary | ICD-10-CM
